# Patient Record
Sex: Female | Born: 1990 | Race: Black or African American | Hispanic: No | Marital: Single | Smoking: Never smoker
Health system: Southern US, Community
[De-identification: ages and names within clinical notes are randomized; demographics above are authoritative.]

## PROBLEM LIST (undated history)

## (undated) ENCOUNTER — Inpatient Hospital Stay (HOSPITAL_COMMUNITY): Payer: Self-pay

## (undated) HISTORY — PX: NO PAST SURGERIES: SHX2092

---

## 2022-02-10 DIAGNOSIS — Z0289 Encounter for other administrative examinations: Secondary | ICD-10-CM | POA: Diagnosis not present

## 2022-02-10 DIAGNOSIS — Z113 Encounter for screening for infections with a predominantly sexual mode of transmission: Secondary | ICD-10-CM | POA: Diagnosis not present

## 2022-02-10 DIAGNOSIS — Z111 Encounter for screening for respiratory tuberculosis: Secondary | ICD-10-CM | POA: Diagnosis not present

## 2022-02-10 DIAGNOSIS — Z114 Encounter for screening for human immunodeficiency virus [HIV]: Secondary | ICD-10-CM | POA: Diagnosis not present

## 2022-02-10 DIAGNOSIS — Z23 Encounter for immunization: Secondary | ICD-10-CM | POA: Diagnosis not present

## 2022-02-20 ENCOUNTER — Other Ambulatory Visit: Payer: Self-pay | Admitting: Obstetrics and Gynecology

## 2022-02-20 ENCOUNTER — Ambulatory Visit
Admission: RE | Admit: 2022-02-20 | Discharge: 2022-02-20 | Disposition: A | Discharge status: Home / Self Care | Payer: No Typology Code available for payment source | Source: Ambulatory Visit | Attending: Obstetrics and Gynecology | Admitting: Obstetrics and Gynecology

## 2022-02-20 DIAGNOSIS — R9389 Abnormal findings on diagnostic imaging of other specified body structures: Secondary | ICD-10-CM

## 2022-02-21 ENCOUNTER — Telehealth: Payer: Self-pay

## 2022-02-21 ENCOUNTER — Other Ambulatory Visit (HOSPITAL_COMMUNITY): Payer: Self-pay

## 2022-02-21 NOTE — Telephone Encounter (Signed)
[facility] Patient Advocate Encounter ? ?Insurance verification completed.   ? ?The patient is uninsured and will need patient assistance for medication. ? ?We can complete the application and will need to meet with the patient for signatures and income documentation. ? ?Ady Heimann, CPhT ?Specialty Pharmacy Patient Advocate ?[facility] ?Phone: [phone] ?Fax:  [phone]  ?

## 2022-02-26 ENCOUNTER — Other Ambulatory Visit: Payer: Self-pay

## 2022-02-26 ENCOUNTER — Other Ambulatory Visit (HOSPITAL_COMMUNITY): Payer: Self-pay

## 2022-02-26 ENCOUNTER — Ambulatory Visit: Payer: Self-pay

## 2022-02-26 ENCOUNTER — Ambulatory Visit (INDEPENDENT_AMBULATORY_CARE_PROVIDER_SITE_OTHER): Payer: Medicaid Other | Admitting: Pharmacist

## 2022-02-26 ENCOUNTER — Ambulatory Visit (INDEPENDENT_AMBULATORY_CARE_PROVIDER_SITE_OTHER): Payer: Medicaid Other | Admitting: Infectious Diseases

## 2022-02-26 ENCOUNTER — Encounter: Payer: Self-pay | Admitting: Infectious Diseases

## 2022-02-26 VITALS — BP 144/98 | HR 96 | Temp 97.4°F | Wt 145.5 lb

## 2022-02-26 DIAGNOSIS — Z0000 Encounter for general adult medical examination without abnormal findings: Secondary | ICD-10-CM | POA: Diagnosis not present

## 2022-02-26 DIAGNOSIS — B20 Human immunodeficiency virus [HIV] disease: Secondary | ICD-10-CM | POA: Diagnosis not present

## 2022-02-26 DIAGNOSIS — Z21 Asymptomatic human immunodeficiency virus [HIV] infection status: Secondary | ICD-10-CM | POA: Insufficient documentation

## 2022-02-26 HISTORY — DX: Human immunodeficiency virus (HIV) disease: B20

## 2022-02-26 HISTORY — DX: Asymptomatic human immunodeficiency virus (hiv) infection status: Z21

## 2022-02-26 MED ORDER — BICTEGRAVIR-EMTRICITAB-TENOFOV 50-200-25 MG PO TABS
1.0000 | ORAL_TABLET | Freq: Every day | ORAL | 5 refills | Status: DC
  Filled 2022-02-26 – 2022-04-09 (×2): qty 30, 30d supply, fill #0
  Filled 2022-05-08: qty 30, 30d supply, fill #1
  Filled 2022-06-09: qty 30, 30d supply, fill #2
  Filled 2022-07-25 – 2022-08-11 (×2): qty 30, 30d supply, fill #3
  Filled 2022-08-28 – 2022-09-26 (×2): qty 30, 30d supply, fill #4
  Filled 2022-10-16 – 2022-10-22 (×2): qty 30, 30d supply, fill #5

## 2022-02-26 NOTE — Assessment & Plan Note (Addendum)
Had pap smear at health department - will try to get records from refugee coordinator.  ?Uncertain as to vaccination history - will also try to get these records and continue with schedule.  ?TB screen at HD negative recently - will get quant here to have on file in system ?Hep b negative with reactive antibody - will repeat here to get on file.  ?Not interested in contraception at this time.  ?

## 2022-02-26 NOTE — Assessment & Plan Note (Signed)
New patient here to establish for HIV care. ? ?Her in [place] as refugee from [place] now 2 months. She is currently taking lopinavir based regimen from what I can deduce from reviewing pill charts with her, but ultimately not quite sure. Will switch to Biktarvy once daily and find out hepatitis B status. 4 weeks samples given today and shipments to be set up with [facility] once she has her Medicaid card available.   ?Pregnancy test recently negative at HD on 4/03 with abstinence as contraception method presently. RPR testing was non reactive 4/03, Hepatitis C negative 4/03, Urine GC/CT negative.  ? ?I discussed with Haizley Dasch treatment options/side effects, benefits of treatment and long-term outcomes. I discussed how HIV is transmitted and the process of untreated HIV including increased risk for opportunistic infections, cancer, dementia and renal failure. Patient was counseled on routine HIV care including medication adherence, blood monitoring, necessary vaccines and follow up visits. Counseled regarding safe sex practices including: condom use, partner disclosure, limiting partners. Patient spent time talking with our pharmacist regarding successful practices of ART and understands to reach out to our clinic in the future with questions.  ? ?General introduction to our clinic and integrated services. ?Will need dental referral at next OV.  ? ?I spent greater than 60 minutes with the patient today with time spent in review of records from outside HD, multiple lab tests and in direct counseling of patient today.  ?

## 2022-02-26 NOTE — Patient Instructions (Addendum)
Please schedule an appointment back in 6 weeks or so so we can make sure your new medication.  ? ?Biktarvy is the pill I would like for you to start taking to treat you - this will need to be taken once a day around the same time.  ?- Common side effects for a short time frame usually include headaches, nausea and diarrhea ?- OK to take over the counter tylenol for headaches and imodium for diarrhea ?- Try taking with food if you are nauseated  ?- If you take any multivitamins or supplements please separate them from your Biktarvy by 6 hours before and after. The main thing is do not have them in the stomach at the same time. ? ? ? ? ?Tafadhali panga ratiba ya kurudi baada ya wiki 6 au zaidi ili tuweze kuhakikisha kuwa una dawa mpya. ? ?Biktarvy ni kidonge ambacho ningependa uanze kukitumia ili [employer] - hii itahitaji kuchukuliwa mara moja kwa siku karibu wakati huo huo. ?- Madhara ya Crenshaw kwa muda mfupi kawaida hujumuisha maumivu ya Ringo, kichefuchefu na Kittie Plater ?- Sawa kuchukua dawa ya tylenol kwa maumivu ya kichwa na imodium ya Kittie Plater ?- Malachi Bonds kuchukua pamoja na chakula ikiwa una kichefuchefu ?- Lavonna Monarch unatumia multivitamini au virutubisho vyovyote tafadhali zitenganishe na Biktarvy yako kwa saa 6 kabla na baada. Jambo kuu ni usiwe nao kwenye tumbo kwa wakati mmoja. ?

## 2022-02-26 NOTE — Progress Notes (Signed)
? ?Name: Caitlin Marks  ?DOB: 06-11-1991 ?MRN: [id number] ?PCP: Pcp, No  ?  ? ?Brief Narrative:  ?Caitlin Marks is a 31 y.o. female with HIV. Dx 2018. Refugee from [place] working with [employer] (Caseworker Caitlin Marks / Caitlin Marks) [phone] ?CD4 nadir unknown  ?HIV Risk: sexual ?History of OIs: none known ?Intake Labs 02/2022: ?Hep B sAg (-), sAb (+), cAb (-); Hep A (), Hep C (-) ?Quantiferon (-) ?G6PD: () ? ? ?Previous Regimens: ?Lopinavir / ritonavir STR she pointed to from [place]  ? ?Genotypes: ?None on file  ? ?Subjective:  ? ?CC: ?New patient for HIV treatment. Referred from [employer].  ? ? ?HPI: ?Her case worker, Caitlin Marks is here today to help interpret for her.  ? ?She has been in the [place] for about 2 months. Came here as a refugee with her two children. She is here with her 65 year old son, Caitlin Marks. She has one older child at home. She does not have a partner and unmarried currently.  ? ?She is taking a pill a day for treatment from [place] but she is not certain what this one is. We spent time looking at charts and online references and she pointed to a lopinavir/ritonavir combination tablet.  She has no side effects to this whatsoever.  She was not born with HIV.  ? ?She has had TB testing at the [facility].  ?She has not been to see a dentist locally - no current problems.  ?Had a pap smear test at local health department recently.  ?She is not currently sexually active with any partner. Feels safe in the home.  Lives in an apartment currently.  ? ?Reports  no complaints today suggestive of associated opportunistic infection or advancing HIV disease such as fevers, night sweats, weight loss, anorexia, cough, SOB, nausea, vomiting, diarrhea, headache, sensory changes, lymphadenopathy or oral thrush. She said she has never had any illnesses from what she recalls related to HIV or otherwise. Healthy childhood. Parent are both deceased from "natural sicknesses."   ? ?Smokes sheesha rarely, no drugs or consistent alcohol use.  ? ? ? ? ?  02/26/2022  ? 10:35 AM  ?Depression screen PHQ 2/9  ?Decreased Interest 0  ?Down, Depressed, Hopeless 0  ?PHQ - 2 Score 0  ? ? ?Review of Systems  ?Constitutional:  Negative for chills, fever, malaise/fatigue and weight loss.  ?HENT:  Negative for sore throat.   ?Respiratory:  Negative for cough, sputum production and shortness of breath.   ?Cardiovascular: Negative.   ?Gastrointestinal:  Negative for abdominal pain, diarrhea and vomiting.  ?Musculoskeletal:  Negative for joint pain, myalgias and neck pain.  ?Skin:  Negative for rash.  ?Neurological:  Negative for headaches.  ?Psychiatric/Behavioral:  Negative for depression and substance abuse. The patient is not nervous/anxious.   ? ?Past Medical History:  ?Diagnosis Date  ? HIV (human immunodeficiency virus infection) (HCC)   ? HIV (human immunodeficiency virus infection) (HCC) 02/26/2022  ? ? ?No outpatient medications prior to visit.  ? ?No facility-administered medications prior to visit.  ?  ? ?No Known Allergies ? ?Social History  ? ?Tobacco Use  ? Smoking status: Some Days  ?  Types: Cigarettes  ? Smokeless tobacco: Never  ?Substance Use Topics  ? Alcohol use: Yes  ?  Comment: occ  ? Drug use: Never  ? ? ?Family History  ?Problem Relation Age of Onset  ? Other Mother   ? Diabetes Neg Hx   ? Cancer  Neg Hx   ? ? ?Social History  ? ?Substance and Sexual Activity  ?Sexual Activity Not on file  ? ? ? ?Objective:  ? ?Vitals:  ? 02/26/22 1039  ?BP: (!) 144/98  ?Pulse: 96  ?Temp: (!) 97.4 ?F (36.3 ?C)  ?TempSrc: Temporal  ?Weight: 145 lb 8.1 oz (66 kg)  ? ?There is no height or weight on file to calculate BMI. ? ?Physical Exam ?Vitals reviewed.  ?HENT:  ?   Mouth/Throat:  ?   Mouth: No oral lesions.  ?   Dentition: No dental abscesses.  ?Cardiovascular:  ?   Rate and Rhythm: Normal rate and regular rhythm.  ?   Heart sounds: Normal heart sounds.  ?Pulmonary:  ?   Effort: Pulmonary effort is  normal.  ?   Breath sounds: Normal breath sounds.  ?Abdominal:  ?   General: There is no distension.  ?   Palpations: Abdomen is soft.  ?   Tenderness: There is no abdominal tenderness.  ?Musculoskeletal:     ?   General: No tenderness. Normal range of motion.  ?Lymphadenopathy:  ?   Cervical: No cervical adenopathy.  ?Skin: ?   General: Skin is warm and dry.  ?   Findings: No rash.  ?Neurological:  ?   Mental Status: She is alert and oriented to person, place, and time.  ?Psychiatric:     ?   Judgment: Judgment normal.  ? ? ?Lab Results ?No results found for: WBC, HGB, HCT, MCV, PLT No results found for: CREATININE, BUN, NA, K, CL, CO2 No results found for: ALT, AST, GGT, ALKPHOS, BILITOT  ?No results found for: CHOL, HDL, LDLCALC, LDLDIRECT, TRIG, CHOLHDL ?No results found for: HIV1RNAQUANT, HIV1RNAVL, CD4TABS ? ? ?Assessment & Plan:  ? ?Problem List Items Addressed This Visit   ? ?  ? Unprioritized  ? Healthcare maintenance  ?  Had pap smear at health department - will try to get records from refugee coordinator.  ?Uncertain as to vaccination history - will also try to get these records and continue with schedule.  ?TB screen at HD negative recently - will get quant here to have on file in system ?Hep b negative with reactive antibody - will repeat here to get on file.  ?Not interested in contraception at this time.  ? ?  ?  ? HIV (human immunodeficiency virus infection) (HCC) - Primary  ?  New patient here to establish for HIV care. ? ?[place] from [place] now 2 months. She is currently taking lopinavir based regimen from what I can deduce from reviewing pill charts with her, but ultimately not quite sure. Will switch to Biktarvy once daily and find out hepatitis B status. 4 weeks samples given today and [facility] to [facility] once she has her Medicaid card available.   ?Pregnancy test recently negative at HD on 4/03 with abstinence as contraception method presently. RPR testing was non  reactive 4/03, Hepatitis C negative 4/03, Urine GC/CT negative.  ? ?I discussed with Gustavo Quest treatment options/side effects, benefits of treatment and long-term outcomes. I discussed how HIV is transmitted and the process of untreated HIV including increased risk for opportunistic infections, cancer, dementia and renal failure. Patient was counseled on routine HIV care including medication adherence, blood monitoring, necessary vaccines and follow up visits. Counseled regarding safe sex practices including: condom use, partner disclosure, limiting partners. Patient spent time talking with our pharmacist regarding successful practices of ART and  understands to reach out to our clinic in the future with questions.  ? ?General introduction to our clinic and integrated services. ?Will need dental referral at next OV.  ? ?I spent greater than 60 minutes with the patient today with time spent in review of records from outside HD, multiple lab tests and in direct counseling of patient today.  ?  ?  ? Relevant Medications  ? bictegravir-emtricitabine-tenofovir AF (BIKTARVY) 50-200-25 MG TABS tablet  ? Other Relevant Orders  ? QuantiFERON-TB Gold Plus  ? Hepatitis B surface antibody,qualitative  ? Hepatitis B surface antigen  ? Hepatitis A Ab, Total  ? HIV 1 RNA quant-no reflex-bld  ? T-helper cells (CD4) count (not at [facility])  ? Hepatitis C Antibody  ? ? ?Rexene Alberts, MSN, NP-C ?[facility] ?[facility] ?Pager: [phone] ?Office: [phone] ? ?02/26/22  ?12:17 PM ?  ?

## 2022-02-26 NOTE — Progress Notes (Signed)
? ?02/26/2022 ? ?HPI: Caitlin Marks is a 31 y.o. female who presents to the [place] clinic today to establish care for HIV infection. ? ?Patient Active Problem List  ? Diagnosis Date Noted  ? HIV (human immunodeficiency virus infection) ([place]) 02/26/2022  ? Healthcare maintenance 02/26/2022  ? ? ?Patient's Medications  ?New Prescriptions  ? BICTEGRAVIR-EMTRICITABINE-TENOFOVIR AF (BIKTARVY) 50-200-25 MG TABS TABLET    Take 1 tablet by mouth daily. Try to take at the same time each day with or without food.  ?Previous Medications  ? No medications on file  ?Modified Medications  ? No medications on file  ?Discontinued Medications  ? No medications on file  ? ? ?Allergies: ?No Known Allergies ? ?Past Medical History: ?Past Medical History:  ?Diagnosis Date  ? HIV (human immunodeficiency virus infection) ([place])   ? HIV (human immunodeficiency virus infection) ([place]) 02/26/2022  ? ? ?Social History: ?Social History  ? ?Socioeconomic History  ? Marital status: Single  ?  Spouse name: Not on file  ? Number of children: Not on file  ? Years of education: Not on file  ? Highest education level: Not on file  ?Occupational History  ? Not on file  ?Tobacco Use  ? Smoking status: Some Days  ?  Types: Cigarettes  ? Smokeless tobacco: Never  ?Substance and Sexual Activity  ? Alcohol use: Yes  ?  Comment: occ  ? Drug use: Never  ? Sexual activity: Not on file  ?Other Topics Concern  ? Not on file  ?Social History Narrative  ? Not on file  ? ?Social Determinants of Health  ? ?Financial Resource Strain: Not on file  ?Food Insecurity: Not on file  ?Transportation Needs: Not on file  ?Physical Activity: Not on file  ?Stress: Not on file  ?Social Connections: Not on file  ? ? ?Labs: ?No results found for: HIV1RNAQUANT, HIV1RNAVL, CD4TABS ? ?RPR and STI ?No results found for: LABRPR, RPRTITER ? ?   ? View : No data to display.  ?  ?  ?  ? ? ?Hepatitis B ?No results found for: HEPBSAB, HEPBSAG, HEPBCAB ?Hepatitis C ?No results found for: [place],  HCVRNAPCRQN ?Hepatitis A ?No results found for: HAV ?Lipids: ?No results found for: CHOL, TRIG, HDL, CHOLHDL, VLDL, LDLCALC ? ?Current HIV Regimen: ?Combo medication dispensed from [place]  ? ?Assessment: ?Caitlin Marks is here today to initiate care with our clinic for HIV infection. Colletta Maryland will start patient on [place]. Will provide samples today as insurance is being processed. Patient's case worker was present and served as our Astronomer.  ? ?Explained that Phillips Odor is a one pill once daily medication with or without food and the importance of not missing any doses. Explained that taking it with food may help reduce side effects. Explained resistance and how it develops and why it is so important to take Biktarvy daily and not skip days or doses. Counseled patient to take it around the same time each day. Cautioned on possible side effects the first week or so including nausea, diarrhea, dizziness, and headaches but that they should resolve after the first couple of weeks. Counseled patient to separate Biktarvy from divalent cations including multivitamins. Provided patient with Amanda's card and to call with any issues/questions/concerns.  ? ?Counseled patient to not take their previous HIV medication today and to stop taking. Counseled patient that they can start taking Biktarvy today. Patient verbalized understanding.  ? ?Informed patient that [place] will contact once insurance is approved for mail order services. Provided  patient with pillbox and pill keychain. ? ?Plan: ?- Biktarvy samples provided to patient (4 bottles of 7 tablets each) ?- Start Biktarvy ?- Follow up in 6 weeks with Colletta Maryland ? ?Marlowe Alt, PharmD Candidate ?02/26/2022 11:40 AM ? ? ? ?

## 2022-02-27 ENCOUNTER — Other Ambulatory Visit: Payer: Self-pay | Admitting: Pharmacist

## 2022-02-27 ENCOUNTER — Other Ambulatory Visit (HOSPITAL_COMMUNITY): Payer: Self-pay

## 2022-02-27 DIAGNOSIS — B20 Human immunodeficiency virus [HIV] disease: Secondary | ICD-10-CM

## 2022-02-27 MED ORDER — BICTEGRAVIR-EMTRICITAB-TENOFOV 50-200-25 MG PO TABS: 1.0000 | ORAL_TABLET | Freq: Every day | ORAL | 0 refills | Status: AC

## 2022-02-27 NOTE — Progress Notes (Signed)
Medication Samples have been provided to the patient. ? ?Drug name: Biktarvy        ?Strength: 50/200/25 mg       ?Qty: 28 tablets (4 bottles) ?LOT: [id number]   ?Exp.Date: 10/24 ? ?Dosing instructions: Take one tablet by mouth once daily ? ?The patient has been instructed regarding the correct time, dose, and frequency of taking this medication, including desired effects and most common side effects.  ? ?Fatih Stalvey, PharmD, CPP ?Clinical Pharmacist Practitioner ?Infectious Diseases Clinical Pharmacist ?[facility] ? ?

## 2022-02-28 LAB — HELPER T-LYMPH-CD4 (ARMC ONLY)
% CD 4 Pos. Lymph.: 42.3 % (ref 30.8–58.5)
Absolute CD 4 Helper: 888 /uL (ref 359–1519)
Basophils Absolute: 0 10*3/uL (ref 0.0–0.2)
Basos: 1 %
EOS (ABSOLUTE): 0.1 10*3/uL (ref 0.0–0.4)
Eos: 1 %
Hematocrit: 46.9 % — ABNORMAL HIGH (ref 34.0–46.6)
Hemoglobin: 15.4 g/dL (ref 11.1–15.9)
Immature Grans (Abs): 0 10*3/uL (ref 0.0–0.1)
Immature Granulocytes: 0 %
Lymphocytes Absolute: 2.1 10*3/uL (ref 0.7–3.1)
Lymphs: 28 %
MCH: 29.6 pg (ref 26.6–33.0)
MCHC: 32.8 g/dL (ref 31.5–35.7)
MCV: 90 fL (ref 79–97)
Monocytes Absolute: 0.3 10*3/uL (ref 0.1–0.9)
Monocytes: 4 %
Neutrophils Absolute: 5.1 10*3/uL (ref 1.4–7.0)
Neutrophils: 66 %
Platelets: 302 10*3/uL (ref 150–450)
RBC: 5.21 x10E6/uL (ref 3.77–5.28)
RDW: 13.4 % (ref 11.7–15.4)
WBC: 7.7 10*3/uL (ref 3.4–10.8)

## 2022-03-01 LAB — QUANTIFERON-TB GOLD PLUS
Mitogen-NIL: 9.58 IU/mL
NIL: 0.17 IU/mL
QuantiFERON-TB Gold Plus: NEGATIVE
TB1-NIL: 0 IU/mL
TB2-NIL: <0 IU/mL

## 2022-03-01 LAB — HIV-1 RNA QUANT-NO REFLEX-BLD
HIV 1 RNA Quant: 31 copies/mL — ABNORMAL HIGH
HIV-1 RNA Quant, Log: 1.49 Log copies/mL — ABNORMAL HIGH

## 2022-03-01 LAB — HEPATITIS C ANTIBODY
Hepatitis C Ab: NONREACTIVE
SIGNAL TO CUT-OFF: 0.42 (ref <1.00)

## 2022-03-01 LAB — HEPATITIS B SURFACE ANTIGEN: Hepatitis B Surface Ag: NONREACTIVE

## 2022-03-01 LAB — HEPATITIS B SURFACE ANTIBODY,QUALITATIVE: Hep B S Ab: REACTIVE — AB

## 2022-03-01 LAB — HEPATITIS A ANTIBODY, TOTAL: Hepatitis A AB,Total: REACTIVE — AB

## 2022-03-03 ENCOUNTER — Other Ambulatory Visit (HOSPITAL_COMMUNITY): Payer: Self-pay

## 2022-03-04 LAB — T-HELPER CELLS (CD4) COUNT (NOT AT ARMC)

## 2022-03-10 ENCOUNTER — Other Ambulatory Visit (HOSPITAL_COMMUNITY): Payer: Self-pay

## 2022-03-11 ENCOUNTER — Encounter: Payer: Self-pay | Admitting: Infectious Diseases

## 2022-03-26 ENCOUNTER — Other Ambulatory Visit (HOSPITAL_COMMUNITY): Payer: Self-pay

## 2022-04-02 NOTE — Congregational Nurse Program (Signed)
  Dept: [phone]   Congregational Nurse Program Note  Date of Encounter: 04/02/2022  Past Medical History: Past Medical History:  Diagnosis Date   HIV (human immunodeficiency virus infection) (HCC)    HIV (human immunodeficiency virus infection) (HCC) 02/26/2022    Encounter Details:  CNP Questionnaire - 04/02/22 1304       Questionnaire   Do you give verbal consent to treat you today? Yes    Location Patient Served  [place]    Visit Setting Church or Organization    Patient Status Refugee    Insurance [facility]    Insurance Referral N/A    Medication N/A    Medical Provider No    Screening Referrals N/A    Medical Referral [facility]    Medical Appointment Made [facility]    Food N/A    Transportation N/A    Housing/Utilities N/A    Interpersonal Safety N/A    Intervention Advocate;Navigate Healthcare System;Case Management;Counsel;Educate;Support    ED Visit Averted Yes    Life-Saving Intervention Made N/A            Patient is followed by Infectious Disease for HIV. Needs PCP. Will establish care with [facility] PCP.  Nicole Cella Bron Snellings RN BSN PCCN  [facility] [phone]-office

## 2022-04-09 ENCOUNTER — Ambulatory Visit (INDEPENDENT_AMBULATORY_CARE_PROVIDER_SITE_OTHER): Payer: Medicaid Other | Admitting: Infectious Diseases

## 2022-04-09 ENCOUNTER — Other Ambulatory Visit (HOSPITAL_COMMUNITY): Payer: Self-pay

## 2022-04-09 ENCOUNTER — Other Ambulatory Visit: Payer: Self-pay

## 2022-04-09 ENCOUNTER — Encounter: Payer: Self-pay | Admitting: Infectious Diseases

## 2022-04-09 VITALS — BP 149/97 | HR 85 | Temp 98.3°F

## 2022-04-09 DIAGNOSIS — R030 Elevated blood-pressure reading, without diagnosis of hypertension: Secondary | ICD-10-CM | POA: Diagnosis not present

## 2022-04-09 DIAGNOSIS — R000 Tachycardia, unspecified: Secondary | ICD-10-CM | POA: Diagnosis not present

## 2022-04-09 DIAGNOSIS — Z21 Asymptomatic human immunodeficiency virus [HIV] infection status: Secondary | ICD-10-CM

## 2022-04-09 DIAGNOSIS — B20 Human immunodeficiency virus [HIV] disease: Secondary | ICD-10-CM

## 2022-04-09 DIAGNOSIS — Z0000 Encounter for general adult medical examination without abnormal findings: Secondary | ICD-10-CM

## 2022-04-09 MED ORDER — BLOOD PRESSURE MONITOR AUTOMAT DEVI
1.0000 | Freq: Once | 0 refills | Status: DC
  Filled 2022-04-09: qty 1, fill #0

## 2022-04-09 MED ORDER — BLOOD PRESSURE MONITOR AUTOMAT DEVI: 1.0000 | Freq: Once | 0 refills | Status: AC

## 2022-04-09 NOTE — Assessment & Plan Note (Signed)
Will try to get her a blood pressure cuff to monitor at home. Counseled on how to measure and record readings. If she can get this will have her bring to future appointments.

## 2022-04-09 NOTE — Progress Notes (Signed)
Name: Caitlin Marks  DOB: 06-04-1991 MRN: [id number] PCP: Pcp, No     Brief Narrative:  Caitlin Marks is a 31 y.o. female with HIV. Dx 2018. Refugee from [place] working with [employer] (Caseworker Ayesha Mohair / Alla German) [phone] CD4 nadir unknown  HIV Risk: sexual History of OIs: none known Intake Labs 02/2022: Hep B sAg (-), sAb (+), cAb (-); Hep A (), Hep C (-) Quantiferon (-) G6PD: ()   Previous Regimens: Lopinavir / ritonavir STR she pointed to from [place]   Genotypes: None on file   Subjective:   CC: FU on new HIV medications.     HPI: Her case worker, Alla German is with her today. Swahili interpretor also present to facilitate.   She has been getting her biktarvy in everyday. Has noticed that she has had some "racing heart" feelings. This does not happen everyday but only occasionally during the month. She feels her heart beats fast but has no other symptoms during these brief periods. No other new medications and has only noticed it since taking the new mediation.   She has not been able to get her fill from the pharmacy yet. Does not recall any missed calls. They discussed that they may do better picking up from pharmacy in person.   She is not very interested in recommended vaccinations.       04/09/2022    2:51 PM  Depression screen PHQ 2/9  Decreased Interest 0  Down, Depressed, Hopeless 0  PHQ - 2 Score 0    Review of Systems  Constitutional:  Negative for chills, fever, malaise/fatigue and weight loss.  HENT:  Negative for sore throat.   Respiratory:  Negative for cough, sputum production and shortness of breath.   Cardiovascular:  Positive for palpitations. Negative for chest pain, orthopnea and leg swelling.  Gastrointestinal:  Negative for abdominal pain, diarrhea and vomiting.  Musculoskeletal:  Negative for joint pain, myalgias and neck pain.  Skin:  Negative for rash.  Neurological:  Negative for headaches.   Psychiatric/Behavioral:  Negative for depression and substance abuse. The patient is not nervous/anxious.     Past Medical History:  Diagnosis Date   HIV (human immunodeficiency virus infection) (HCC)    HIV (human immunodeficiency virus infection) (HCC) 02/26/2022    Outpatient Medications Prior to Visit  Medication Sig Dispense Refill   bictegravir-emtricitabine-tenofovir AF (BIKTARVY) 50-200-25 MG TABS tablet Take 1 tablet by mouth daily. Try to take at the same time each day with or without food. 30 tablet 5   No facility-administered medications prior to visit.     No Known Allergies  Social History   Tobacco Use   Smoking status: Former    Types: Cigarettes   Smokeless tobacco: Never  Substance Use Topics   Alcohol use: Yes    Comment: occasionally   Drug use: Never    Family History  Problem Relation Age of Onset   Other Mother    Diabetes Neg Hx    Cancer Neg Hx     Social History   Substance and Sexual Activity  Sexual Activity Not on file   Comment: condoms accepted     Objective:   Vitals:   04/09/22 1443  BP: (!) 149/97  Pulse: 85  Temp: 98.3 F (36.8 C)  TempSrc: Oral  SpO2: 100%   There is no height or weight on file to calculate BMI.  Physical Exam Vitals reviewed.  HENT:     Mouth/Throat:  Mouth: No oral lesions.     Dentition: No dental abscesses.  Cardiovascular:     Rate and Rhythm: Normal rate and regular rhythm.     Heart sounds: Normal heart sounds.  Pulmonary:     Effort: Pulmonary effort is normal.     Breath sounds: Normal breath sounds.  Abdominal:     General: There is no distension.     Palpations: Abdomen is soft.     Tenderness: There is no abdominal tenderness.  Musculoskeletal:        General: No tenderness. Normal range of motion.  Lymphadenopathy:     Cervical: No cervical adenopathy.  Skin:    General: Skin is warm and dry.     Findings: No rash.  Neurological:     Mental Status: She is alert and  oriented to person, place, and time.  Psychiatric:        Judgment: Judgment normal.    Lab Results Lab Results  Component Value Date   WBC 7.7 02/26/2022   HGB 15.4 02/26/2022   HCT 46.9 (H) 02/26/2022   MCV 90 02/26/2022   PLT 302 02/26/2022   No results found for: CREATININE, BUN, NA, K, CL, CO2 No results found for: ALT, AST, GGT, ALKPHOS, BILITOT  No results found for: CHOL, HDL, LDLCALC, LDLDIRECT, TRIG, CHOLHDL HIV 1 RNA Quant (copies/mL)  Date Value  02/26/2022 31 (H)     Assessment & Plan:   Problem List Items Addressed This Visit       Unprioritized   Elevated blood pressure reading - Primary    Will try to get her a blood pressure cuff to monitor at home. Counseled on how to measure and record readings. If she can get this will have her bring to future appointments.        Relevant Medications   Blood Pressure Monitoring (BLOOD PRESSURE MONITOR AUTOMAT) DEVI   Healthcare maintenance    Prevnar 20 & menveo recommended - counseled and she declined.  Hepatitis B and A immune.   Pap smear due April 2024.        HIV (human immunodeficiency virus infection) (HCC)    Will check HIV RNA with med switch today. She declined vaccines.  Will see if Marchelle Folks can help to sort out issues with her Biktarvy for further fills. She has active medicaid and should be covered.  Pap due in April 2024.   RTC in 43m.        Racing heart beat    Cardiac exam normal today. HR 80s. I have not seen this as a side effect to Biktarvy in the last. Does not indicate it is related to anxiety at all.   Will check pregnancy test, TSH and A1C to be complete and rule out metabolic causes. No associated cardiac symptoms.        Relevant Orders   hCG, quantitative, pregnancy   TSH + free T4   Hemoglobin A1c   Other Visit Diagnoses     HIV disease (HCC)       Relevant Orders   HIV 1 RNA quant-no reflex-bld      Rexene Alberts, MSN, NP-C [facility] for Infectious  Disease [facility] Pager: [phone] Office: [phone]  04/09/22  3:46 PM

## 2022-04-09 NOTE — Assessment & Plan Note (Signed)
Will check HIV RNA with med switch today. She declined vaccines.  Will see if Marchelle Folks can help to sort out issues with her Biktarvy for further fills. She has active medicaid and should be covered.  Pap due in April 2024.   RTC in 74m.

## 2022-04-09 NOTE — Assessment & Plan Note (Signed)
Cardiac exam normal today. HR 80s. I have not seen this as a side effect to Biktarvy in the last. Does not indicate it is related to anxiety at all.   Will check pregnancy test, TSH and A1C to be complete and rule out metabolic causes. No associated cardiac symptoms.

## 2022-04-09 NOTE — Assessment & Plan Note (Addendum)
Prevnar 20 & menveo recommended - counseled and she declined.  Hepatitis B and A immune.   Pap smear due April 2024.

## 2022-04-09 NOTE — Patient Instructions (Addendum)
Would like to see your blood pressure no higher than 140/90 (prefer closer to 130/80). Check your blood pressure a few times a week - sometimes in the morning, sometimes at night and write them down to bring to your next appointment.   Your heart rate is normal today at 85 beats per minute (normal is 60-100).   I will check a few things with your blood work to make sure nothing abnormal to explain the heart beat racing feeling. I don't see where this could be caused by the [facility] though and have not heard anyone else describe that before.   I do recommend the pneumonia vaccine and meningitis vaccine if you are willing to get it. These vaccines are given periodically for all people to help prevent serious illness related to them.   Please come back for your next visit in 6 months.      Je, ungependa kuona shinikizo lako la damu lisizidi 140/90 (pendelea karibu 130/80). Angalia shinikizo la damu yako mara chache kwa wiki - wakati mwingine asubuhi, wakati mwingine usiku na uandike ili [place] miadi [place].  Kiwango cha moyo wako ni cha kawaida leo kwa midundo 85 kwa dakika (kawaida ni 60-100).  Nitaangalia mambo machache na [place] ya damu ili [place] kitu kisicho cha kawaida kuelezea hisia ya mpigo wa moyo. Sioni ni wapi hii inaweza kusababishwa na [place] ingawa na sijasikia mtu mwingine yeyote akielezea hilo hapo awali.  Ninapendekeza chanjo ya nimonia na chanjo ya meninjitisi ikiwa uko tayari kuipata. Chanjo hizi hutolewa mara kwa mara kwa watu wote ili kusaidia [place] ugonjwa mbaya unaohusiana nao.   Tafadhali rudi kwa ziara yako ijayo baada ya miezi 6.

## 2022-04-13 LAB — TSH+FREE T4: TSH W/REFLEX TO FT4: 2.23 mIU/L

## 2022-04-13 LAB — HEMOGLOBIN A1C
Hgb A1c MFr Bld: 5.2 % of total Hgb (ref <5.7)
Mean Plasma Glucose: 103 mg/dL
eAG (mmol/L): 5.7 mmol/L

## 2022-04-13 LAB — HIV-1 RNA QUANT-NO REFLEX-BLD
HIV 1 RNA Quant: NOT DETECTED Copies/mL
HIV-1 RNA Quant, Log: NOT DETECTED Log cps/mL

## 2022-04-13 LAB — HCG, QUANTITATIVE, PREGNANCY: HCG, Total, QN: <5 m[IU]/mL

## 2022-04-16 ENCOUNTER — Other Ambulatory Visit (HOSPITAL_COMMUNITY): Payer: Self-pay

## 2022-04-17 ENCOUNTER — Telehealth: Payer: Self-pay

## 2022-04-17 NOTE — Telephone Encounter (Signed)
Patient has received a medical bill and is requesting assistance to add insurance information. [facility] called and medicaid information added to her account.  Nicole Cella Kymia Simi RN BSN PCCN  [facility] [phone]-cell [phone]-office

## 2022-04-23 NOTE — Progress Notes (Signed)
  Subjective:    Caitlin Marks - 31 y.o. female MRN [id number]  Date of birth: 12-12-1990  HPI  Caitlin Marks is to establish care.   Current issues and/or concerns: None   ROS per HPI    Health Maintenance:  Health Maintenance Due  Topic Date Due   COVID-19 Vaccine (1) Never done   TETANUS/TDAP  Never done   PAP-Cervical Cytology Screening  Never done   PAP SMEAR-Modifier  Never done     Past Medical History: Patient Active Problem List   Diagnosis Date Noted   Elevated blood pressure reading 04/09/2022   Racing heart beat 04/09/2022   HIV (human immunodeficiency virus infection) (HCC) 02/26/2022   Healthcare maintenance 02/26/2022    Social History   reports that she has quit smoking. Her smoking use included cigarettes. She has been exposed to tobacco smoke. She has never used smokeless tobacco. She reports current alcohol use. She reports that she does not use drugs.   Family History  family history includes Other in her mother.   Medications: reviewed and updated   Objective:   Physical Exam BP (!) 145/91 (BP Location: Left Arm, Patient Position: Sitting, Cuff Size: Normal)   Pulse 71   Temp 98.3 F (36.8 C)   Resp 16   Ht 4' 11.84" (1.52 m)   Wt 161 lb (73 kg)   SpO2 98%   BMI 31.61 kg/m   Physical Exam HENT:     Head: Normocephalic and atraumatic.  Eyes:     Extraocular Movements: Extraocular movements intact.     Conjunctiva/sclera: Conjunctivae normal.     Pupils: Pupils are equal, round, and reactive to light.  Cardiovascular:     Rate and Rhythm: Normal rate and regular rhythm.     Pulses: Normal pulses.     Heart sounds: Normal heart sounds.  Pulmonary:     Effort: Pulmonary effort is normal.     Breath sounds: Normal breath sounds.  Musculoskeletal:     Cervical back: Normal range of motion and neck supple.  Neurological:     General: No focal deficit present.     Mental Status: She is alert and oriented to person, place, and time.   Psychiatric:        Mood and Affect: Mood normal.        Behavior: Behavior normal.       Assessment & Plan:  1. Encounter to establish care - Patient presents today to establish care.  - Return for annual physical examination, labs, and health maintenance. Arrive fasting meaning having no food for at least 8 hours prior to appointment. You may have only water or black coffee. Please take scheduled medications as normal.  2. Language barrier - [place] in-person interpreter, Jocelyn.     Patient was given clear instructions to go to Emergency Department or return to medical center if symptoms don't improve, worsen, or new problems develop.The patient verbalized understanding.  I discussed the assessment and treatment plan with the patient. The patient was provided an opportunity to ask questions and all were answered. The patient agreed with the plan and demonstrated an understanding of the instructions.   The patient was advised to call back or seek an in-person evaluation if the symptoms worsen or if the condition fails to improve as anticipated.    [name], NP 05/01/2022, 10:37 AM Primary Care at [facility]

## 2022-04-28 ENCOUNTER — Other Ambulatory Visit (HOSPITAL_COMMUNITY): Payer: Self-pay

## 2022-04-29 ENCOUNTER — Telehealth: Payer: Self-pay

## 2022-04-29 NOTE — Telephone Encounter (Signed)
Patient's sponsor/case manager called wanting to know where to get an at-home BP monitor. States they tried [employer] on [address], but they only had manual cuffs.   Recommended [facility] or [facility], asked them to please call back if they are still having difficulty finding the equipment.   Sandie Ano, RN

## 2022-05-01 ENCOUNTER — Encounter: Payer: Self-pay | Admitting: Family

## 2022-05-01 ENCOUNTER — Other Ambulatory Visit (HOSPITAL_COMMUNITY): Payer: Self-pay

## 2022-05-01 ENCOUNTER — Telehealth: Payer: Self-pay

## 2022-05-01 ENCOUNTER — Ambulatory Visit (INDEPENDENT_AMBULATORY_CARE_PROVIDER_SITE_OTHER): Payer: Medicaid Other | Admitting: Family

## 2022-05-01 VITALS — BP 145/91 | HR 71 | Temp 98.3°F | Resp 16 | Ht 59.84 in | Wt 161.0 lb

## 2022-05-01 DIAGNOSIS — Z7689 Persons encountering health services in other specified circumstances: Secondary | ICD-10-CM

## 2022-05-01 DIAGNOSIS — Z789 Other specified health status: Secondary | ICD-10-CM | POA: Diagnosis not present

## 2022-05-01 NOTE — Telephone Encounter (Signed)
Contacted patient with appointment reminder.Transportation assistance provided.  Meosha Castanon RN BSN PCCN  [facility] [phone]-cell [phone]-office  

## 2022-05-01 NOTE — Patient Instructions (Signed)
Thank you for choosing Primary Care at [facility] for your medical home!    Caitlin Marks was seen by Caitlin Fendt, NP today.   Caitlin Marks's primary care provider is Caitlin Fendt, NP.   For the best care possible,  you should try to see Caitlin Stabs, NP whenever you come to office.   We look forward to seeing you again soon!  If you have any questions about your visit today,  please call us at [phone]  Or feel free to reach your provider via MyChart.   Keeping you healthy   Get these tests Blood pressure- Have your blood pressure checked once a year by your healthcare provider.  Normal blood pressure is 120/80. Weight- Have your body mass index (BMI) calculated to screen for obesity.  BMI is a measure of body fat based on height and weight. You can also calculate your own BMI at https://www.west-esparza.com/. Cholesterol- Have your cholesterol checked regularly starting at age 46, sooner may be necessary if you have diabetes, high blood pressure, if a family member developed heart diseases at an early age or if you smoke.  Chlamydia, HIV, and other sexual transmitted disease- Get screened each year until the age of 11 then within three months of each new sexual partner. Diabetes- Have your blood sugar checked regularly if you have high blood pressure, high cholesterol, a family history of diabetes or if you are overweight.   Get these vaccines Flu shot- Every fall. Tetanus shot- Every 10 years. Menactra- Single dose; prevents meningitis.   Take these steps Don't smoke- If you do smoke, ask your healthcare provider about quitting. For tips on how to quit, go to www.smokefree.gov or call 1-800-QUIT-NOW. Be physically active- Exercise 5 days a week for at least 30 minutes.  If you are not already physically active start slow and gradually work up to 30 minutes of moderate physical activity.  Examples of moderate activity include walking briskly, mowing the yard, dancing, swimming  bicycling, etc. Eat a healthy diet- Eat a variety of healthy foods such as fruits, vegetables, low fat milk, low fat cheese, yogurt, lean meats, poultry, fish, beans, tofu, etc.  For more information on healthy eating, go to www.thenutritionsource.org Drink alcohol in moderation- Limit alcohol intake two drinks or less a day.  Never drink and drive. Dentist- Brush and floss teeth twice daily; visit your dentis twice a year. Depression-Your emotional health is as important as your physical health.  If you're feeling down, losing interest in things you normally enjoy please talk with your healthcare provider. Gun Safety- If you keep a gun in your home, keep it unloaded and with the safety lock on.  Bullets should be stored separately. Helmet use- Always wear a helmet when riding a motorcycle, bicycle, rollerblading or skateboarding. Safe sex- If you may be exposed to a sexually transmitted infection, use a condom Seat belts- Seat bels can save your life; always wear one. Smoke/Carbon Monoxide detectors- These detectors need to be installed on the appropriate level of your home.  Replace batteries at least once a year. Skin Cancer- When out in the sun, cover up and use sunscreen SPF 15 or higher. Violence- If anyone is threatening or hurting you, please tell your healthcare provider.

## 2022-05-05 ENCOUNTER — Other Ambulatory Visit (HOSPITAL_COMMUNITY): Payer: Self-pay

## 2022-05-08 ENCOUNTER — Other Ambulatory Visit (HOSPITAL_COMMUNITY): Payer: Self-pay

## 2022-05-09 ENCOUNTER — Other Ambulatory Visit (HOSPITAL_COMMUNITY): Payer: Self-pay

## 2022-05-14 ENCOUNTER — Other Ambulatory Visit (HOSPITAL_COMMUNITY): Payer: Self-pay

## 2022-05-28 ENCOUNTER — Other Ambulatory Visit (HOSPITAL_COMMUNITY): Payer: Self-pay

## 2022-05-30 ENCOUNTER — Other Ambulatory Visit (HOSPITAL_COMMUNITY): Payer: Self-pay

## 2022-06-09 ENCOUNTER — Other Ambulatory Visit (HOSPITAL_COMMUNITY): Payer: Self-pay

## 2022-06-16 ENCOUNTER — Other Ambulatory Visit (HOSPITAL_COMMUNITY): Payer: Self-pay

## 2022-06-23 NOTE — Progress Notes (Signed)
Erroneous encounter-disregard

## 2022-06-24 ENCOUNTER — Other Ambulatory Visit (HOSPITAL_COMMUNITY): Payer: Self-pay

## 2022-07-01 ENCOUNTER — Encounter: Payer: Self-pay | Admitting: Family

## 2022-07-01 DIAGNOSIS — Z124 Encounter for screening for malignant neoplasm of cervix: Secondary | ICD-10-CM

## 2022-07-01 DIAGNOSIS — Z789 Other specified health status: Secondary | ICD-10-CM

## 2022-07-01 DIAGNOSIS — Z13228 Encounter for screening for other metabolic disorders: Secondary | ICD-10-CM

## 2022-07-01 DIAGNOSIS — Z13220 Encounter for screening for lipoid disorders: Secondary | ICD-10-CM

## 2022-07-01 DIAGNOSIS — Z113 Encounter for screening for infections with a predominantly sexual mode of transmission: Secondary | ICD-10-CM

## 2022-07-01 DIAGNOSIS — Z0000 Encounter for general adult medical examination without abnormal findings: Secondary | ICD-10-CM

## 2022-07-02 ENCOUNTER — Other Ambulatory Visit (HOSPITAL_COMMUNITY): Payer: Self-pay

## 2022-07-07 ENCOUNTER — Other Ambulatory Visit (HOSPITAL_COMMUNITY): Payer: Self-pay

## 2022-07-09 ENCOUNTER — Other Ambulatory Visit (HOSPITAL_COMMUNITY): Payer: Self-pay

## 2022-07-25 ENCOUNTER — Other Ambulatory Visit (HOSPITAL_COMMUNITY): Payer: Self-pay

## 2022-07-26 ENCOUNTER — Other Ambulatory Visit (HOSPITAL_COMMUNITY): Payer: Self-pay

## 2022-07-30 ENCOUNTER — Other Ambulatory Visit (HOSPITAL_COMMUNITY): Payer: Self-pay

## 2022-08-08 ENCOUNTER — Other Ambulatory Visit (HOSPITAL_COMMUNITY): Payer: Self-pay

## 2022-08-11 ENCOUNTER — Other Ambulatory Visit (HOSPITAL_COMMUNITY): Payer: Self-pay

## 2022-08-28 ENCOUNTER — Other Ambulatory Visit (HOSPITAL_COMMUNITY): Payer: Self-pay

## 2022-09-04 ENCOUNTER — Other Ambulatory Visit (HOSPITAL_COMMUNITY): Payer: Self-pay

## 2022-09-15 ENCOUNTER — Other Ambulatory Visit (HOSPITAL_COMMUNITY): Payer: Self-pay

## 2022-09-17 ENCOUNTER — Other Ambulatory Visit (HOSPITAL_COMMUNITY): Payer: Self-pay

## 2022-09-24 ENCOUNTER — Other Ambulatory Visit (HOSPITAL_COMMUNITY): Payer: Self-pay

## 2022-09-24 ENCOUNTER — Telehealth: Payer: Self-pay

## 2022-09-24 NOTE — Telephone Encounter (Signed)
[facility] Patient Advocate Encounter  [facility] and I have been unsuccsessful in reaching patient to be able to refill medication.    We have tried multiple times without a response.  Daelyn Mozer, CPhT Specialty Pharmacy Patient Advocate [facility] Phone: [phone] Fax:  [phone]  

## 2022-09-26 ENCOUNTER — Other Ambulatory Visit (HOSPITAL_COMMUNITY): Payer: Self-pay

## 2022-09-29 ENCOUNTER — Other Ambulatory Visit (HOSPITAL_COMMUNITY): Payer: Self-pay

## 2022-09-30 ENCOUNTER — Other Ambulatory Visit (HOSPITAL_COMMUNITY): Payer: Self-pay

## 2022-09-30 ENCOUNTER — Ambulatory Visit: Payer: Self-pay | Admitting: Infectious Diseases

## 2022-10-07 ENCOUNTER — Other Ambulatory Visit (HOSPITAL_COMMUNITY): Payer: Self-pay

## 2022-10-16 ENCOUNTER — Other Ambulatory Visit (HOSPITAL_COMMUNITY): Payer: Self-pay

## 2022-10-22 ENCOUNTER — Other Ambulatory Visit: Payer: Self-pay

## 2022-10-22 ENCOUNTER — Other Ambulatory Visit (HOSPITAL_COMMUNITY): Payer: Self-pay

## 2022-10-23 ENCOUNTER — Other Ambulatory Visit (HOSPITAL_COMMUNITY): Payer: Self-pay

## 2022-10-24 ENCOUNTER — Other Ambulatory Visit: Payer: Self-pay

## 2022-11-18 ENCOUNTER — Other Ambulatory Visit: Payer: Self-pay | Admitting: Infectious Diseases

## 2022-11-18 ENCOUNTER — Other Ambulatory Visit (HOSPITAL_COMMUNITY): Payer: Self-pay

## 2022-11-18 ENCOUNTER — Other Ambulatory Visit: Payer: Self-pay | Admitting: Family

## 2022-11-18 ENCOUNTER — Other Ambulatory Visit: Payer: Self-pay

## 2022-11-18 DIAGNOSIS — Z21 Asymptomatic human immunodeficiency virus [HIV] infection status: Secondary | ICD-10-CM

## 2022-11-18 NOTE — Telephone Encounter (Signed)
Refused this rx because prescribed by Janene Madeira, NP with ID at [facility].

## 2022-11-19 ENCOUNTER — Other Ambulatory Visit: Payer: Self-pay

## 2022-11-19 ENCOUNTER — Other Ambulatory Visit (HOSPITAL_COMMUNITY): Payer: Self-pay

## 2022-11-19 ENCOUNTER — Telehealth: Payer: Self-pay

## 2022-11-19 MED ORDER — BIKTARVY 50-200-25 MG PO TABS
1.0000 | ORAL_TABLET | Freq: Every day | ORAL | 0 refills | Status: DC
  Filled 2022-11-19: qty 30, 30d supply, fill #0

## 2022-11-19 NOTE — Telephone Encounter (Signed)
Spoke with [employer] no longer works there. They will check and see if patient has a new case Freight forwarder. Asked them to please call us back with case manager's contact info.   Beryle Flock, RN

## 2022-11-19 NOTE — Telephone Encounter (Signed)
Called patient with [place] interpreter ([place] # [phone]). She needs an appointment with [facility] [place], provided her with our address and phone number.   She will call back after she speaks with her ride to get their availability and see when they can bring her. Asked her to call the front desk and request [place] interpreter.   Beryle Flock, RN

## 2022-11-20 ENCOUNTER — Other Ambulatory Visit (HOSPITAL_COMMUNITY): Payer: Self-pay

## 2022-12-16 ENCOUNTER — Other Ambulatory Visit (HOSPITAL_COMMUNITY): Payer: Self-pay

## 2022-12-17 DIAGNOSIS — J069 Acute upper respiratory infection, unspecified: Secondary | ICD-10-CM | POA: Insufficient documentation

## 2022-12-17 DIAGNOSIS — R058 Other specified cough: Secondary | ICD-10-CM | POA: Diagnosis not present

## 2022-12-17 DIAGNOSIS — Z1152 Encounter for screening for COVID-19: Secondary | ICD-10-CM | POA: Insufficient documentation

## 2022-12-22 ENCOUNTER — Other Ambulatory Visit: Payer: Self-pay | Admitting: Infectious Diseases

## 2022-12-22 ENCOUNTER — Other Ambulatory Visit (HOSPITAL_COMMUNITY): Payer: Self-pay

## 2022-12-22 ENCOUNTER — Other Ambulatory Visit: Payer: Self-pay

## 2022-12-22 DIAGNOSIS — Z21 Asymptomatic human immunodeficiency virus [HIV] infection status: Secondary | ICD-10-CM

## 2022-12-22 MED ORDER — BIKTARVY 50-200-25 MG PO TABS
1.0000 | ORAL_TABLET | Freq: Every day | ORAL | 0 refills | Status: DC
  Filled 2022-12-22: qty 30, 30d supply, fill #0

## 2023-01-05 ENCOUNTER — Other Ambulatory Visit (HOSPITAL_COMMUNITY): Payer: Self-pay

## 2023-01-06 ENCOUNTER — Other Ambulatory Visit (HOSPITAL_COMMUNITY): Payer: Self-pay

## 2023-01-08 ENCOUNTER — Other Ambulatory Visit (HOSPITAL_COMMUNITY): Payer: Self-pay

## 2023-01-29 ENCOUNTER — Other Ambulatory Visit (HOSPITAL_COMMUNITY): Payer: Self-pay

## 2023-02-02 ENCOUNTER — Other Ambulatory Visit (HOSPITAL_COMMUNITY): Payer: Self-pay

## 2023-02-04 ENCOUNTER — Other Ambulatory Visit (HOSPITAL_COMMUNITY): Payer: Self-pay

## 2023-02-04 ENCOUNTER — Other Ambulatory Visit: Payer: Self-pay | Admitting: Infectious Diseases

## 2023-02-04 DIAGNOSIS — Z21 Asymptomatic human immunodeficiency virus [HIV] infection status: Secondary | ICD-10-CM

## 2023-02-04 NOTE — Telephone Encounter (Signed)
Attempted to call pt to schedule appt. Not able to leave vm

## 2023-02-05 ENCOUNTER — Other Ambulatory Visit (HOSPITAL_COMMUNITY): Payer: Self-pay

## 2023-02-06 ENCOUNTER — Other Ambulatory Visit (HOSPITAL_COMMUNITY): Payer: Self-pay

## 2023-02-06 ENCOUNTER — Other Ambulatory Visit: Payer: Self-pay | Admitting: Infectious Diseases

## 2023-02-06 DIAGNOSIS — Z21 Asymptomatic human immunodeficiency virus [HIV] infection status: Secondary | ICD-10-CM

## 2023-02-09 ENCOUNTER — Other Ambulatory Visit (HOSPITAL_COMMUNITY): Payer: Self-pay

## 2023-02-09 MED ORDER — BIKTARVY 50-200-25 MG PO TABS
1.0000 | ORAL_TABLET | Freq: Every day | ORAL | 0 refills | Status: DC
  Filled 2023-02-09: qty 30, 30d supply, fill #0

## 2023-02-12 ENCOUNTER — Other Ambulatory Visit (HOSPITAL_COMMUNITY): Payer: Self-pay

## 2023-02-12 DIAGNOSIS — Z23 Encounter for immunization: Secondary | ICD-10-CM | POA: Diagnosis not present

## 2023-03-03 ENCOUNTER — Other Ambulatory Visit (HOSPITAL_COMMUNITY): Payer: Self-pay

## 2023-03-06 ENCOUNTER — Other Ambulatory Visit (HOSPITAL_COMMUNITY): Payer: Self-pay

## 2023-03-06 ENCOUNTER — Other Ambulatory Visit: Payer: Self-pay | Admitting: Infectious Diseases

## 2023-03-06 DIAGNOSIS — Z21 Asymptomatic human immunodeficiency virus [HIV] infection status: Secondary | ICD-10-CM

## 2023-03-09 ENCOUNTER — Other Ambulatory Visit: Payer: Self-pay

## 2023-03-09 ENCOUNTER — Other Ambulatory Visit (HOSPITAL_COMMUNITY): Payer: Self-pay

## 2023-03-09 ENCOUNTER — Telehealth: Payer: Self-pay

## 2023-03-09 MED ORDER — BIKTARVY 50-200-25 MG PO TABS
1.0000 | ORAL_TABLET | Freq: Every day | ORAL | 0 refills | Status: DC
Start: 2023-03-09 — End: 2023-04-07
  Filled 2023-03-09: qty 30, 30d supply, fill #0

## 2023-03-09 NOTE — Telephone Encounter (Signed)
-----   Message from Lucrezia Europe sent at 03/09/2023 11:41 AM EDT ----- I reached out with an interpreter. So, pt did not schedule. She stated that she works from 6a-6p. I continued to try to ask if there are days that she has off, but she said that she can maybe make it work in June. I offered days in June and she said she can "maybe" make it. At this point I was unable to schedule her, but the interpreter confirmed our # and she was going to call, for she would not set anything with me.  ----- Message ----- From: Sandie Ano, RN Sent: 03/09/2023   8:43 AM EDT To: Sheran Spine; Lucrezia Europe; Marin Shutter  Patient overdue for appointment, I don't think her case manager is working with her anymore. Could one of you reach out to her to schedule? I'll send in 30 days of meds. Thanks!

## 2023-03-17 ENCOUNTER — Other Ambulatory Visit (HOSPITAL_COMMUNITY): Payer: Self-pay

## 2023-03-18 ENCOUNTER — Other Ambulatory Visit (HOSPITAL_COMMUNITY): Payer: Self-pay

## 2023-03-26 ENCOUNTER — Other Ambulatory Visit (HOSPITAL_COMMUNITY): Payer: Self-pay

## 2023-04-03 ENCOUNTER — Other Ambulatory Visit: Payer: Self-pay

## 2023-04-03 NOTE — Congregational Nurse Program (Signed)
  Dept: [phone]   Congregational Nurse Program Note  Date of Encounter: 04/03/2023  Past Medical History: Past Medical History:  Diagnosis Date   HIV (human immunodeficiency virus infection) (HCC)    HIV (human immunodeficiency virus infection) (HCC) 02/26/2022    Encounter Details:  CNP Questionnaire - 04/03/23 1608       Questionnaire   Ask client: Do you give verbal consent for me to treat you today? Yes    Student Assistance N/A    Location Patient Served  [facility]    Visit Setting with Client Organization    Patient Status Refugee    Insurance Medicaid    Insurance/Financial Assistance Referral N/A    Medication Have Medication Insecurities    Medical Provider Yes    Medical Referrals Made [facility]    Medical Appointment Made [facility]    Recently w/o PCP, now 1st time PCP visit completed due to CNs referral or appointment made N/A    Food N/A    Transportation N/A    Housing/Utilities N/A    Interpersonal Safety N/A    Interventions Advocate/Support;Navigate Healthcare System;Case Management;Counsel;Educate    Abnormal to Normal Screening Since Last CN Visit N/A    Screenings CN Performed N/A    Sent Client to Lab for: N/A    Did client attend any of the following based off CNs referral or appointments made? N/A    ED Visit Averted Yes    Life-Saving Intervention Made N/A            Patient assisted to make a follow up appointment with infectious disease and to make new patient appointment with GYN for Implant removal.  Arman Bogus RN BSN PCCN  [facility] [phone]-cell [phone]-office

## 2023-04-07 ENCOUNTER — Other Ambulatory Visit: Payer: Self-pay

## 2023-04-07 ENCOUNTER — Other Ambulatory Visit: Payer: Self-pay | Admitting: Infectious Diseases

## 2023-04-07 ENCOUNTER — Telehealth: Payer: Self-pay

## 2023-04-07 ENCOUNTER — Ambulatory Visit: Payer: Self-pay | Admitting: Infectious Diseases

## 2023-04-07 ENCOUNTER — Other Ambulatory Visit (HOSPITAL_COMMUNITY): Payer: Self-pay

## 2023-04-07 DIAGNOSIS — Z21 Asymptomatic human immunodeficiency virus [HIV] infection status: Secondary | ICD-10-CM

## 2023-04-07 MED ORDER — BIKTARVY 50-200-25 MG PO TABS
1.0000 | ORAL_TABLET | Freq: Every day | ORAL | 0 refills | Status: DC
Start: 2023-04-07 — End: 2023-05-04
  Filled 2023-04-07: qty 30, 30d supply, fill #0

## 2023-04-07 NOTE — Telephone Encounter (Signed)
I have contacted patient regarding missed appointment today. Patient did not give any valid reason for missing appointment today.  Nicole Cella Lekendrick Alpern RN BSN PCCN  [facility] [phone] [phone]-office

## 2023-04-08 ENCOUNTER — Other Ambulatory Visit (HOSPITAL_COMMUNITY): Payer: Self-pay

## 2023-04-09 ENCOUNTER — Other Ambulatory Visit (HOSPITAL_COMMUNITY): Payer: Self-pay

## 2023-04-09 ENCOUNTER — Encounter (HOSPITAL_COMMUNITY): Payer: Self-pay | Admitting: Emergency Medicine

## 2023-04-09 ENCOUNTER — Telehealth: Payer: Self-pay

## 2023-04-09 ENCOUNTER — Ambulatory Visit (HOSPITAL_COMMUNITY)
Admission: EM | Admit: 2023-04-09 | Discharge: 2023-04-09 | Disposition: A | Discharge status: Home / Self Care | Payer: Medicaid Other | Attending: Internal Medicine | Admitting: Internal Medicine

## 2023-04-09 DIAGNOSIS — R0789 Other chest pain: Secondary | ICD-10-CM | POA: Diagnosis not present

## 2023-04-09 MED ORDER — TRAMADOL HCL 50 MG PO TABS
50.0000 mg | ORAL_TABLET | Freq: Four times a day (QID) | ORAL | 0 refills | Status: DC | PRN
  Filled 2023-04-09: qty 15, 4d supply, fill #0

## 2023-04-09 MED ORDER — METHOCARBAMOL 500 MG PO TABS
500.0000 mg | ORAL_TABLET | Freq: Every evening | ORAL | 0 refills | Status: DC | PRN
  Filled 2023-04-09: qty 20, 20d supply, fill #0

## 2023-04-09 NOTE — Discharge Instructions (Addendum)
Rest and elevate the affected painful area.   Apply cold compresses intermittently as needed.   Please take medication as directed. Please do not drive or operate heavy machinery after taking muscle relaxants because it makes you drowsy As pain recedes, begin normal activities slowly as tolerated.  Call if symptoms persist.

## 2023-04-09 NOTE — Telephone Encounter (Signed)
Patient informed that a follow up appointment is scheduled with Infectious disease on 05/04/23 at 10 am.  Arman Bogus RN BSN PCCN  [facility] [phone]-cell [phone]-office

## 2023-04-09 NOTE — ED Triage Notes (Signed)
Used interpretor  Pt c/o pain in right arm, chest and having difficulty breathing for 6 months. Reports work related and employer told needs to be seen and has paperwork filled out. Regarding work restrictions and limitations.   Taking ibuprofen for pain.

## 2023-04-10 ENCOUNTER — Ambulatory Visit: Payer: Self-pay | Admitting: *Deleted

## 2023-04-10 ENCOUNTER — Encounter: Payer: Self-pay | Admitting: Family

## 2023-04-10 NOTE — ED Provider Notes (Signed)
[facility]    CSN: [id number] Arrival date & time: 04/09/23  0857      History   Chief Complaint Chief Complaint  Patient presents with   Arm Pain    HPI Caitlin Marks is a 32 y.o. female comes to urgent care with right upper arm pain, right-sided chest pain and pain on taking a deep breath.  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  She describes the pain as sharp and throbbing and aggravated by movement with no known relieving factors.  Pain does not radiate into the upper extremity but rather in pain involves the right side of the chest and the right upper arm.  No falls or trauma.  No neck pain.  No weakness in the right upper extremity.  No numbness or tingling.  No weight loss or night sweats.   HPI  Past Medical History:  Diagnosis Date   HIV (human immunodeficiency virus infection) (HCC)    HIV (human immunodeficiency virus infection) (HCC) 02/26/2022    Patient Active Problem List   Diagnosis Date Noted   Elevated blood pressure reading 04/09/2022   Racing heart beat 04/09/2022   HIV (human immunodeficiency virus infection) (HCC) 02/26/2022   Healthcare maintenance 02/26/2022    Past Surgical History:  Procedure Laterality Date   NO PAST SURGERIES      OB History   No obstetric history on file.      Home Medications    Prior to Admission medications   Medication Sig Start Date End Date Taking? Authorizing Provider  methocarbamol (ROBAXIN) 500 MG tablet Take 1 tablet (500 mg total) by mouth at bedtime as needed for muscle spasms. 04/09/23  Yes Amberlin Utke, Britta Mccreedy, MD  traMADol (ULTRAM) 50 MG tablet Take 1 tablet (50 mg total) by mouth every 6 (six) hours as needed. 04/09/23  Yes Keimya Briddell, Britta Mccreedy, MD  bictegravir-emtricitabine-tenofovir AF (BIKTARVY) 50-200-25 MG TABS tablet Take 1 tablet by mouth daily. Try to take at the same time each day with or without food. 04/07/23   Blanchard Kelch, NP    Family History Family History  Problem Relation Age of Onset   Other Mother    Diabetes Neg Hx    Cancer Neg Hx     Social History Social History   Tobacco Use   Smoking status: Former    Types: Cigarettes    Passive exposure: Past   Smokeless tobacco: Never  Vaping Use   Vaping Use: Never used  Substance Use Topics   Alcohol use: Yes    Comment: occasionally   Drug use: Never     Allergies   Patient has no known allergies.   Review of Systems Review of Systems As per HPI  Physical Exam Triage Vital Signs ED Triage Vitals [04/09/23 1031]  Enc Vitals Group     BP (!) 159/107     Pulse Rate (!) 101     Resp 16     Temp 98 F (36.7 C)     Temp Source Oral     SpO2 98 %     Weight      Height      Head Circumference      Peak Flow      Pain Score      Pain Loc      Pain Edu?      Excl. in GC?    No  data found.  Updated Vital Signs BP (!) 159/107 (BP Location: Left Arm)   Pulse (!) 101   Temp 98 F (36.7 C) (Oral)   Resp 16   LMP 03/30/2023   SpO2 98%   Visual Acuity Right Eye Distance:   Left Eye Distance:   Bilateral Distance:    Right Eye Near:   Left Eye Near:    Bilateral Near:     Physical Exam Vitals and nursing note reviewed.  Constitutional:      General: She is not in acute distress.    Appearance: She is not ill-appearing.  Cardiovascular:     Rate and Rhythm: Normal rate and regular rhythm.     Pulses: Normal pulses.     Heart sounds: Normal heart sounds.  Pulmonary:     Effort: Pulmonary effort is normal.     Comments: Pectoralis major tenderness on palpation.  No bruising noted.  Full range of motion of the right shoulder. Abdominal:     General: Bowel sounds are normal.     Palpations: Abdomen is soft.  Musculoskeletal:     Cervical back: Normal range of motion.  Neurological:     Mental Status: She is alert.      UC Treatments / Results  Labs (all labs ordered are listed, but only  abnormal results are displayed) Labs Reviewed - No data to display  EKG   Radiology No results found.  Procedures Procedures (including critical care time)  Medications Ordered in UC Medications - No data to display  Initial Impression / Assessment and Plan / UC Course  I have reviewed the triage vital signs and the nursing notes.  Pertinent labs & imaging results that were available during my care of the patient were reviewed by me and considered in my medical decision making (see chart for details).     1.  Musculoskeletal chest wall pain: Tramadol 50 mg every 6 hours as needed for pain Robaxin at bedtime as needed Gentle range of motion exercises Cold compress as needed No indication for imaging Return precautions given. Final Clinical Impressions(s) / UC Diagnoses   Final diagnoses:  Musculoskeletal chest pain     Discharge Instructions      Rest and elevate the affected painful area.   Apply cold compresses intermittently as needed.   Please take medication as directed. Please do not drive or operate heavy machinery after taking muscle relaxants because it makes you drowsy As pain recedes, begin normal activities slowly as tolerated.  Call if symptoms persist.    ED Prescriptions     Medication Sig Dispense Auth. Provider   methocarbamol (ROBAXIN) 500 MG tablet Take 1 tablet (500 mg total) by mouth at bedtime as needed for muscle spasms. 20 tablet Brynlea Spindler, Britta Mccreedy, MD   traMADol (ULTRAM) 50 MG tablet Take 1 tablet (50 mg total) by mouth every 6 (six) hours as needed. 15 tablet Derron Pipkins, Britta Mccreedy, MD      I have reviewed the PDMP during this encounter.   Merrilee Jansky, MD 04/10/23 1314

## 2023-04-10 NOTE — Telephone Encounter (Signed)
Patient's brother called due to language barrier and when asked his name he would not share information. Called patient back using interpreter ID # [phone] to talk with patient. Brother not on DPR Chief Complaint: chest pain , right shoulder/ arm pain Symptoms: see above. C/o breathing in causes right arm chest pain requesting paperwork to be completed due to job issues. Afraid to lose job Frequency: greater than 1 year Pertinent Negatives: Patient denies chest pain constant no nausea no sweating  Disposition: [] ED /[] Urgent Care (no appt availability in office) / [x] Appointment(In office/virtual)/ []  [facility]/ [] Home Care/ [] Refused Recommended Disposition /[] [facility]/ []  Follow-up with PCP Additional Notes:   Appt scheduled 04/15/23. Recommended if pain worsens go back to ED.     Reason for Disposition  [1] Chest pain from known angina comes and goes AND [2] is NOT happening more often (increasing in frequency) or getting worse (increasing in severity)  Answer Assessment - Initial Assessment Questions 1. LOCATION: "Where does it hurt?"       Below right chest above ribs 2. RADIATION: "Does the pain go anywhere else?" (e.g., into neck, jaw, arms, back)     Arm shoulder and back  3. ONSET: "When did the chest pain begin?" (Minutes, hours or days)      Since started working did not give a time approx. 1 year  4. PATTERN: "Does the pain come and go, or has it been constant since it started?"  "Does it get worse with exertion?"      Comes and goes  5. DURATION: "How long does it last" (e.g., seconds, minutes, hours)     Na  6. SEVERITY: "How bad is the pain?"  (e.g., Scale 1-10; mild, moderate, or severe)    - MILD (1-3): doesn't interfere with normal activities     - MODERATE (4-7): interferes with normal activities or awakens from sleep    - SEVERE (8-10): excruciating pain, unable to do any normal activities       Worsening pain last seen in ED 04/09/23 7.  CARDIAC RISK FACTORS: "Do you have any history of heart problems or risk factors for heart disease?" (e.g., angina, prior heart attack; diabetes, high blood pressure, high cholesterol, smoker, or strong family history of heart disease)     See hx  8. PULMONARY RISK FACTORS: "Do you have any history of lung disease?"  (e.g., blood clots in lung, asthma, emphysema, birth control pills)     na 9. CAUSE: "What do you think is causing the chest pain?"     Not sure  10. OTHER SYMPTOMS: "Do you have any other symptoms?" (e.g., dizziness, nausea, vomiting, sweating, fever, difficulty breathing, cough)       Pain in shoulder, chest, right side  11. PREGNANCY: "Is there any chance you are pregnant?" "When was your last menstrual period?"       na  Protocols used: Chest Pain-A-AH

## 2023-04-14 ENCOUNTER — Other Ambulatory Visit (HOSPITAL_COMMUNITY): Payer: Self-pay

## 2023-04-14 ENCOUNTER — Other Ambulatory Visit: Payer: Self-pay | Admitting: Family

## 2023-04-14 NOTE — Progress Notes (Signed)
Patient ID: Caitlin Marks, female    DOB: 03-24-1991  MRN: [id number]  CC: Urgent Care Follow-Up  Subjective: Caitlin Marks is a 32 y.o. female who presents for Urgent Care follow-up. She is accompanied by her brother.  Her concerns today include:  04/09/2023 [facility] per MD note: Initial Impression / Assessment and Plan / UC Course  I have reviewed the triage vital signs and the nursing notes.   Pertinent labs & imaging results that were available during my care of the patient were reviewed by me and considered in my medical decision making (see chart for details).     1.  Musculoskeletal chest wall pain: Tramadol 50 mg every 6 hours as needed for pain Robaxin at bedtime as needed Gentle range of motion exercises Cold compress as needed No indication for imaging Return precautions given.  04/10/2023 per triage RN call note: Patient's brother called due to language barrier and when asked his name he would not share information. Called patient back using interpreter ID # [phone] to talk with patient. Brother not on DPR Chief Complaint: chest pain , right shoulder/ arm pain Symptoms: see above. C/o breathing in causes right arm chest pain requesting paperwork to be completed due to job issues. Afraid to lose job Frequency: greater than 1 year Pertinent Negatives: Patient denies chest pain constant no nausea no sweating  Disposition: [] ED /[] Urgent Care (no appt availability in office) / [x] Appointment(In office/virtual)/ []  [facility]/ [] Home Care/ [] Refused Recommended Disposition /[]  Mobile Bus/ []  Follow-up with PCP Additional Notes:    Appt scheduled 04/15/23. Recommended if pain worsens go back to ED.       Reason for Disposition  [1] Chest pain from known angina comes and goes AND [2] is NOT happening more often (increasing in frequency) or getting worse (increasing in severity)  Answer Assessment - Initial Assessment  Questions 1. LOCATION: "Where does it hurt?"       Below right chest above ribs 2. RADIATION: "Does the pain go anywhere else?" (e.g., into neck, jaw, arms, back)     Arm shoulder and back  3. ONSET: "When did the chest pain begin?" (Minutes, hours or days)      Since started working did not give a time approx. 1 year  4. PATTERN: "Does the pain come and go, or has it been constant since it started?"  "Does it get worse with exertion?"      Comes and goes  5. DURATION: "How long does it last" (e.g., seconds, minutes, hours)     Na  6. SEVERITY: "How bad is the pain?"  (e.g., Scale 1-10; mild, moderate, or severe)    - MILD (1-3): doesn't interfere with normal activities     - MODERATE (4-7): interferes with normal activities or awakens from sleep    - SEVERE (8-10): excruciating pain, unable to do any normal activities       Worsening pain last seen in ED 04/09/23 7. CARDIAC RISK FACTORS: "Do you have any history of heart problems or risk factors for heart disease?" (e.g., angina, prior heart attack; diabetes, high blood pressure, high cholesterol, smoker, or strong family history of heart disease)     See hx  8. PULMONARY RISK FACTORS: "Do you have any history of lung disease?"  (e.g., blood clots in lung, asthma, emphysema, birth control pills)     na 9. CAUSE: "What do you think is causing the chest pain?"  Not sure  10. OTHER SYMPTOMS: "Do you have any other symptoms?" (e.g., dizziness, nausea, vomiting, sweating, fever, difficulty breathing, cough)       Pain in shoulder, chest, right side  11. PREGNANCY: "Is there any chance you are pregnant?" "When was your last menstrual period?"       na  Protocols used: Chest Pain-A-AH  Today's visit 04/15/2023: - Reports chronic right shoulder and arm pain persisting x 6 months. Radiates to chest. Pain medication prescribed at Urgent Care with minimal improvement. She denies recent trauma/injury. She is an Human resources officer at [employer]. Job title  [employer]. Patient presents with Medical and Physical Capabilities Assessment form for completion from her employer. Patient reports her right shoulder/arm pain is related to her job and that she cannot lift greater than 10 pounds. She would like for like for primary provider to make her employer aware of lifting restriction. Patient reports that she would like for her employer to give her another job role where she does not have to lift but only "close boxes" Also, patient requests work letter to remain out of work beginning today and to return on 04/27/2023.  - Blood pressure check. She does not check her blood pressure outside of office. The patient does not complain of red flag symptoms such as but not limited to chest pain, shortness of breath, worst headache of life, nausea/vomiting.  - No further issues/concerns for discussion today.   Patient Active Problem List   Diagnosis Date Noted   Primary hypertension 04/15/2023   Elevated blood pressure reading 04/09/2022   Racing heart beat 04/09/2022   HIV (human immunodeficiency virus infection) (HCC) 02/26/2022   Healthcare maintenance 02/26/2022     Current Outpatient Medications on File Prior to Visit  Medication Sig Dispense Refill   bictegravir-emtricitabine-tenofovir AF (BIKTARVY) 50-200-25 MG TABS tablet Take 1 tablet by mouth daily. Try to take at the same time each day with or without food. 30 tablet 0   methocarbamol (ROBAXIN) 500 MG tablet Take 1 tablet (500 mg total) by mouth at bedtime as needed for muscle spasms. 20 tablet 0   traMADol (ULTRAM) 50 MG tablet Take 1 tablet (50 mg total) by mouth every 6 (six) hours as needed. 15 tablet 0   No current facility-administered medications on file prior to visit.    No Known Allergies  Social History   Socioeconomic History   Marital status: Single    Spouse name: Not on file   Number of children: Not on file   Years of education: Not on file   Highest education  level: Not on file  Occupational History   Not on file  Tobacco Use   Smoking status: Former    Types: Cigarettes    Passive exposure: Past   Smokeless tobacco: Never  Vaping Use   Vaping Use: Never used  Substance and Sexual Activity   Alcohol use: Yes    Comment: occasionally   Drug use: Never   Sexual activity: Not on file    Comment: condoms accepted  Other Topics Concern   Not on file  Social History Narrative   Not on file   Social Determinants of Health   Financial Resource Strain: Not on file  Food Insecurity: Not on file  Transportation Needs: Not on file  Physical Activity: Not on file  Stress: Not on file  Social Connections: Not on file  Intimate Partner Violence: Not on file    Family History  Problem Relation Age  of Onset   Other Mother    Diabetes Neg Hx    Cancer Neg Hx     Past Surgical History:  Procedure Laterality Date   NO PAST SURGERIES      ROS: Review of Systems Negative except as stated above  PHYSICAL EXAM: BP (!) 158/112   Pulse 94   Temp 98.1 F (36.7 C) (Oral)   Resp 16   Wt 166 lb 6.4 oz (75.5 kg)   LMP 03/30/2023   SpO2 98%   BMI 32.67 kg/m   Physical Exam HENT:     Head: Normocephalic and atraumatic.     Nose: Nose normal.     Mouth/Throat:     Mouth: Mucous membranes are moist.     Pharynx: Oropharynx is clear.  Eyes:     Extraocular Movements: Extraocular movements intact.     Conjunctiva/sclera: Conjunctivae normal.     Pupils: Pupils are equal, round, and reactive to light.  Cardiovascular:     Rate and Rhythm: Normal rate and regular rhythm.     Pulses: Normal pulses.     Heart sounds: Normal heart sounds.  Pulmonary:     Effort: Pulmonary effort is normal.     Breath sounds: Normal breath sounds.  Musculoskeletal:     Right shoulder: Tenderness present.     Left shoulder: Normal.     Right upper arm: Normal.     Left upper arm: Normal.     Right elbow: Normal.     Left elbow: Normal.     Right  forearm: Normal.     Left forearm: Normal.     Right wrist: Normal.     Left wrist: Normal.     Right hand: Normal.     Left hand: Normal.     Cervical back: Normal, normal range of motion and neck supple.     Thoracic back: Normal.     Lumbar back: Normal.     Right hip: Normal.     Left hip: Normal.     Right upper leg: Normal.     Left upper leg: Normal.     Right knee: Normal.     Left knee: Normal.     Right lower leg: Normal.     Left lower leg: Normal.     Right ankle: Normal.     Left ankle: Normal.     Right foot: Normal.     Left foot: Normal.  Neurological:     General: No focal deficit present.     Mental Status: She is alert and oriented to person, place, and time.  Psychiatric:        Mood and Affect: Mood normal.        Behavior: Behavior normal.     ASSESSMENT AND PLAN: 1. Chronic right shoulder pain 2. Musculoskeletal chest pain - Continue present management.  - Patient provided with work letter to return to work on 04/27/2023 with no heavy lifting greater than 10 pounds. - Referral to Orthopedics for further evaluation/management. During the interim follow-up with primary provider as scheduled. I discussed with patient in detail Medical and Physical Capabilities Assessment form to be completed by Orthopedics. Patient verbalized understanding/agreement. - Ambulatory referral to Orthopedics  3. Primary hypertension - New onset.  - Blood pressure not at goal during today's visit. Patient asymptomatic without chest pressure, chest pain, palpitations, shortness of breath, worst headache of life, and any additional red flag symptoms. - Begin Valsartan as prescribed. Counseled on medication  adherence/adverse effects. - Routine screening.  - Counseled on blood pressure goal of less than 130/80, low-sodium, DASH diet, medication compliance, and 150 minutes of moderate intensity exercise per week as tolerated. Counseled on medication adherence and adverse effects. -  Follow-up with primary provider in 2 weeks or sooner if needed.  - valsartan (DIOVAN) 40 MG tablet; Take 1 tablet (40 mg) by mouth daily.  Dispense: 30 tablet; Refill: 1 - CMP14+EGFR  4. Diabetes mellitus screening - Routine screening.  - Hemoglobin A1c  5. Screening cholesterol level - Routine screening.  - Lipid panel  6. Language barrier - [employer]. Name: Jeanette Caprice  ID#: [id number]     Patient was given the opportunity to ask questions.  Patient verbalized understanding of the plan and was able to repeat key elements of the plan. Patient was given clear instructions to go to Emergency Department or return to medical center if symptoms don't improve, worsen, or new problems develop.The patient verbalized understanding.   Orders Placed This Encounter  Procedures   Lipid panel   Hemoglobin A1c   CMP14+EGFR   Ambulatory referral to Orthopedics     Requested Prescriptions   Signed Prescriptions Disp Refills   valsartan (DIOVAN) 40 MG tablet 30 tablet 1    Sig: Take 1 tablet (40 mg) by mouth daily.    Return in about 2 weeks (around 04/29/2023) for Follow-Up or next available blood pressure check.  Rema Fendt, NP

## 2023-04-15 ENCOUNTER — Encounter: Payer: Self-pay | Admitting: Family

## 2023-04-15 ENCOUNTER — Ambulatory Visit (INDEPENDENT_AMBULATORY_CARE_PROVIDER_SITE_OTHER): Payer: Medicaid Other | Admitting: Family

## 2023-04-15 ENCOUNTER — Other Ambulatory Visit (HOSPITAL_COMMUNITY): Payer: Self-pay

## 2023-04-15 VITALS — BP 158/112 | HR 94 | Temp 98.1°F | Resp 16 | Wt 166.4 lb

## 2023-04-15 DIAGNOSIS — Z131 Encounter for screening for diabetes mellitus: Secondary | ICD-10-CM

## 2023-04-15 DIAGNOSIS — G8929 Other chronic pain: Secondary | ICD-10-CM | POA: Diagnosis not present

## 2023-04-15 DIAGNOSIS — I10 Essential (primary) hypertension: Secondary | ICD-10-CM | POA: Diagnosis not present

## 2023-04-15 DIAGNOSIS — Z758 Other problems related to medical facilities and other health care: Secondary | ICD-10-CM

## 2023-04-15 DIAGNOSIS — M25511 Pain in right shoulder: Secondary | ICD-10-CM | POA: Diagnosis not present

## 2023-04-15 DIAGNOSIS — R0789 Other chest pain: Secondary | ICD-10-CM

## 2023-04-15 DIAGNOSIS — Z603 Acculturation difficulty: Secondary | ICD-10-CM | POA: Diagnosis not present

## 2023-04-15 DIAGNOSIS — Z13220 Encounter for screening for lipoid disorders: Secondary | ICD-10-CM

## 2023-04-15 MED ORDER — VALSARTAN 40 MG PO TABS
40.0000 mg | ORAL_TABLET | Freq: Every day | ORAL | 1 refills | Status: DC
Start: 2023-04-15 — End: 2023-10-29
  Filled 2023-04-15 (×2): qty 30, 30d supply, fill #0
  Filled 2023-05-04: qty 30, 30d supply, fill #1

## 2023-04-15 NOTE — Progress Notes (Signed)
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 Patient said she would like her position change at work due to pain in arm and chest. - Patient would also like to work in another area where it is not cold -medication refill BP

## 2023-04-16 ENCOUNTER — Other Ambulatory Visit: Payer: Self-pay | Admitting: Family

## 2023-04-16 ENCOUNTER — Telehealth: Payer: Self-pay | Admitting: *Deleted

## 2023-04-16 DIAGNOSIS — Z13228 Encounter for screening for other metabolic disorders: Secondary | ICD-10-CM

## 2023-04-16 LAB — CMP14+EGFR
ALT: 31 IU/L (ref 0–32)
AST: 45 IU/L — ABNORMAL HIGH (ref 0–40)
Albumin/Globulin Ratio: 1.3 (ref 1.2–2.2)
Albumin: 4.3 g/dL (ref 4.0–5.0)
Alkaline Phosphatase: 72 IU/L (ref 44–121)
BUN/Creatinine Ratio: 7 — ABNORMAL LOW (ref 9–23)
BUN: 8 mg/dL (ref 6–20)
Bilirubin Total: 0.3 mg/dL (ref 0.0–1.2)
CO2: 25 mmol/L (ref 20–29)
Calcium: 9.2 mg/dL (ref 8.7–10.2)
Chloride: 103 mmol/L (ref 96–106)
Creatinine, Ser: 1.07 mg/dL — ABNORMAL HIGH (ref 0.57–1.00)
Globulin, Total: 3.3 g/dL (ref 1.5–4.5)
Glucose: 86 mg/dL (ref 70–99)
Potassium: 4.5 mmol/L (ref 3.5–5.2)
Sodium: 140 mmol/L (ref 134–144)
Total Protein: 7.6 g/dL (ref 6.0–8.5)
eGFR: 72 mL/min/{1.73_m2} (ref >59)

## 2023-04-16 LAB — HEMOGLOBIN A1C
Est. average glucose Bld gHb Est-mCnc: 114 mg/dL
Hgb A1c MFr Bld: 5.6 % (ref 4.8–5.6)

## 2023-04-16 LAB — LIPID PANEL
Chol/HDL Ratio: 2.8 ratio (ref 0.0–4.4)
Cholesterol, Total: 144 mg/dL (ref 100–199)
HDL: 52 mg/dL (ref >39)
LDL Chol Calc (NIH): 79 mg/dL (ref 0–99)
Triglycerides: 64 mg/dL (ref 0–149)
VLDL Cholesterol Cal: 13 mg/dL (ref 5–40)

## 2023-04-16 NOTE — Telephone Encounter (Signed)
Patient Film/video editor, Caitlin Marks called and was asking if provider,Amy is willing to fill out a form stating patient can not lift over 10/20 lbs. I explain that provider had stated that our office is not equip to asset lifting abilities for patient.Provider did referral patient to [facility] for further evaluation of lifting.

## 2023-04-28 ENCOUNTER — Other Ambulatory Visit (INDEPENDENT_AMBULATORY_CARE_PROVIDER_SITE_OTHER): Payer: Medicaid Other

## 2023-04-28 ENCOUNTER — Ambulatory Visit: Payer: Medicaid Other | Admitting: Physician Assistant

## 2023-04-28 ENCOUNTER — Encounter: Payer: Self-pay | Admitting: Physician Assistant

## 2023-04-28 ENCOUNTER — Ambulatory Visit (INDEPENDENT_AMBULATORY_CARE_PROVIDER_SITE_OTHER): Payer: Medicaid Other | Admitting: Physician Assistant

## 2023-04-28 DIAGNOSIS — M25511 Pain in right shoulder: Secondary | ICD-10-CM | POA: Diagnosis not present

## 2023-04-28 DIAGNOSIS — G8929 Other chronic pain: Secondary | ICD-10-CM

## 2023-04-28 NOTE — Progress Notes (Signed)
Office Visit Note   Patient: Caitlin Marks           Date of Birth: 1990-11-25           MRN: [id number] Visit Date: 04/28/2023              Requested by: Rema Fendt, NP [address] [place],  [place] [address] PCP: Rema Fendt, NP  Chief Complaint  Patient presents with   Right Shoulder - Pain      HPI: Patient is a pleasant 32 year old woman who is seen with the assistance of an interpreter today.  She has a 59-month history of muscular chest wall pain and right shoulder pain.  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  She had no particular injury but says the repetitiveness of this has caused her pain.  Hurts with overhead activity denies any radicular symptoms.  She was seen in an urgent care who did not do x-rays and felt this was all muscular.  They did put her on a muscle relaxant.  She comes in today because she needs work restrictions through her for her employer.  Assessment & Plan: Visit Diagnoses:  1. Chronic right shoulder pain     Plan: X-rays were benign.  She does not have any significant cervical or shoulder pathology.  I do think this is all muscular in general.  I will restrict her work limitation in lifting for the next 6 weeks.  I do think she should engage in physical therapy and she is willing to do this.  Will follow-up with her in 6 weeks.  Follow-Up Instructions: 6 weeks  Ortho Exam  Patient is alert, oriented, no adenopathy, well-dressed, normal affect, normal respiratory effort. Right shoulder: She can overhead her arm but does have some pain with this strength is 5 out of 5 with external and internal rotation of her arm resisted abduction grip strength.  She has no paresthesias.  She has a mildly positive speeds and empty can tests no apprehension she does have palpable tenderness over the chest wall.  No shortness of breath no dyspnea.  Imaging: No results found. No images are attached to the  encounter.  Labs: Lab Results  Component Value Date   HGBA1C 5.6 04/15/2023   HGBA1C 5.2 04/09/2022     Lab Results  Component Value Date   ALBUMIN 4.3 04/15/2023    No results found for: "MG" No results found for: "VD25OH"  No results found for: "PREALBUMIN"    Latest Ref Rng & Units 02/26/2022   11:04 AM  CBC EXTENDED  WBC 3.4 - 10.8 x10E3/uL 7.7   RBC 3.77 - 5.28 x10E6/uL 5.21   Hemoglobin 11.1 - 15.9 g/dL 19.1   HCT 47.8 - 29.5 % 46.9   Platelets 150 - 450 x10E3/uL 302   NEUT# 1.4 - 7.0 x10E3/uL 5.1   Lymph# 0.7 - 3.1 x10E3/uL 2.1      There is no height or weight on file to calculate BMI.  Orders:  Orders Placed This Encounter  Procedures   XR Cervical Spine 2 or 3 views   XR Shoulder Right   No orders of the defined types were placed in this encounter.    Procedures: No procedures performed  Clinical Data: No additional findings.  ROS:  All other systems negative, except as noted in the HPI. Review of Systems  Objective: Vital Signs: LMP 03/30/2023   Specialty Comments:  No  specialty comments available.  PMFS History: Patient Active Problem List   Diagnosis Date Noted   Primary hypertension 04/15/2023   Elevated blood pressure reading 04/09/2022   Racing heart beat 04/09/2022   HIV (human immunodeficiency virus infection) (HCC) 02/26/2022   Healthcare maintenance 02/26/2022   Past Medical History:  Diagnosis Date   HIV (human immunodeficiency virus infection) (HCC)    HIV (human immunodeficiency virus infection) (HCC) 02/26/2022    Family History  Problem Relation Age of Onset   Other Mother    Diabetes Neg Hx    Cancer Neg Hx     Past Surgical History:  Procedure Laterality Date   NO PAST SURGERIES     Social History   Occupational History   Not on file  Tobacco Use   Smoking status: Former    Types: Cigarettes    Passive exposure: Past   Smokeless tobacco: Never  Vaping Use   Vaping Use: Never used  Substance and  Sexual Activity   Alcohol use: Yes    Comment: occasionally   Drug use: Never   Sexual activity: Not on file    Comment: condoms accepted

## 2023-04-29 ENCOUNTER — Telehealth: Payer: Self-pay | Admitting: Physician Assistant

## 2023-04-29 ENCOUNTER — Telehealth: Payer: Self-pay

## 2023-04-29 ENCOUNTER — Ambulatory Visit: Payer: Medicaid Other | Admitting: Physician Assistant

## 2023-04-29 ENCOUNTER — Other Ambulatory Visit (HOSPITAL_COMMUNITY): Payer: Self-pay

## 2023-04-29 NOTE — Congregational Nurse Program (Signed)
  Dept: [phone]   Congregational Nurse Program Note  Date of Encounter: 04/07/2023  Past Medical History: Past Medical History:  Diagnosis Date   HIV (human immunodeficiency virus infection) (HCC)    HIV (human immunodeficiency virus infection) (HCC) 02/26/2022    Encounter Details:  CNP Questionnaire - 04/29/23 1326       Questionnaire   Ask client: Do you give verbal consent for me to treat you today? Yes    Student Assistance N/A    Location Patient Served  [facility]    Visit Setting with Client Organization    Patient Status Refugee    Insurance Medicaid    Insurance/Financial Assistance Referral N/A    Medication Have Medication Insecurities    Medical Provider Yes    Screening Referrals Made N/A    Medical Referrals Made [facility]    Medical Appointment Made [facility]    Recently w/o PCP, now 1st time PCP visit completed due to CNs referral or appointment made N/A    Food N/A    Transportation N/A    Housing/Utilities N/A    Interpersonal Safety N/A    Interventions Advocate/Support;Navigate Healthcare System;Case Management;Counsel;Educate    Abnormal to Normal Screening Since Last CN Visit N/A    Screenings CN Performed N/A    Sent Client to Lab for: N/A    Did client attend any of the following based off CNs referral or appointments made? N/A    ED Visit Averted Yes    Life-Saving Intervention Made N/A            Patient called with appointment reminder.  Nicole Cella Sayda Grable RN BSN PCCN  [facility] [phone] [phone]-office

## 2023-04-29 NOTE — Telephone Encounter (Signed)
Patient called with appointment reminder  Carles Florea RN BSN PCCN  [facility] [phone]-cell [phone]-office  

## 2023-04-29 NOTE — Telephone Encounter (Signed)
FMLA forms received. To [employer]. 

## 2023-05-01 ENCOUNTER — Other Ambulatory Visit (HOSPITAL_COMMUNITY): Payer: Self-pay

## 2023-05-04 ENCOUNTER — Other Ambulatory Visit (HOSPITAL_COMMUNITY): Payer: Self-pay

## 2023-05-04 ENCOUNTER — Encounter: Payer: Self-pay | Admitting: Infectious Diseases

## 2023-05-04 ENCOUNTER — Ambulatory Visit (INDEPENDENT_AMBULATORY_CARE_PROVIDER_SITE_OTHER): Payer: Medicaid Other | Admitting: Infectious Diseases

## 2023-05-04 ENCOUNTER — Other Ambulatory Visit: Payer: Self-pay

## 2023-05-04 VITALS — BP 148/110 | HR 96 | Temp 98.0°F | Resp 16 | Wt 162.0 lb

## 2023-05-04 DIAGNOSIS — I10 Essential (primary) hypertension: Secondary | ICD-10-CM

## 2023-05-04 DIAGNOSIS — Z113 Encounter for screening for infections with a predominantly sexual mode of transmission: Secondary | ICD-10-CM | POA: Diagnosis not present

## 2023-05-04 DIAGNOSIS — Z0000 Encounter for general adult medical examination without abnormal findings: Secondary | ICD-10-CM | POA: Diagnosis not present

## 2023-05-04 DIAGNOSIS — Z21 Asymptomatic human immunodeficiency virus [HIV] infection status: Secondary | ICD-10-CM

## 2023-05-04 LAB — CBC
Platelets: 230 10*3/uL (ref 140–400)
RBC: 4.85 10*6/uL (ref 3.80–5.10)
WBC: 5 10*3/uL (ref 3.8–10.8)

## 2023-05-04 MED ORDER — BIKTARVY 50-200-25 MG PO TABS
1.0000 | ORAL_TABLET | Freq: Every day | ORAL | 11 refills | Status: DC
Start: 2023-05-04 — End: 2024-04-07
  Filled 2023-05-04 (×2): qty 30, 30d supply, fill #0
  Filled 2023-06-02: qty 30, 30d supply, fill #1
  Filled 2023-06-24: qty 30, 30d supply, fill #2
  Filled 2023-07-22: qty 30, 30d supply, fill #3
  Filled 2023-08-25: qty 30, 30d supply, fill #4
  Filled 2023-09-14: qty 30, 30d supply, fill #5
  Filled 2023-10-07: qty 30, 30d supply, fill #6
  Filled 2023-11-26: qty 30, 30d supply, fill #7
  Filled 2023-12-18: qty 30, 30d supply, fill #8
  Filled 2024-01-08: qty 30, 30d supply, fill #9
  Filled 2024-02-04: qty 30, 30d supply, fill #10
  Filled 2024-03-10: qty 30, 30d supply, fill #11

## 2023-05-04 NOTE — Progress Notes (Signed)
Name: Caitlin Marks  DOB: 09-Feb-1991 MRN: [id number] PCP: Rema Fendt, NP     Brief Narrative:  Caitlin Marks is a 32 y.o. female with HIV. Dx 2018. 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     HPI: Swahili interpretor on the phone today via [employer].   She was last seen in May 2023 - she is doing well and reports no concerns she has.   Her blood pressure is a little high today  She has been getting and taking her biktarvy fine wihtout any trouble.   She has not been able to get her fill from the pharmacy yet. Does not recall any missed calls. They discussed that they may do better picking up from pharmacy in person.   She has no interest in any preventative vaccines. No plans for any children at present - she would eventually like to have 1 more but not right now. She had one pap smear done prior to coming to the [place] (2 years ago) - she reports that it was normal.       05/04/2023    9:38 AM  Depression screen PHQ 2/9  Decreased Interest 0  Down, Depressed, Hopeless 0  PHQ - 2 Score 0    Review of Systems  Constitutional:  Negative for chills, fever, malaise/fatigue and weight loss.  HENT:  Negative for sore throat.   Respiratory:  Negative for cough, sputum production and shortness of breath.   Cardiovascular:  Positive for palpitations. Negative for chest pain, orthopnea and leg swelling.  Gastrointestinal:  Negative for abdominal pain, diarrhea and vomiting.  Musculoskeletal:  Negative for joint pain, myalgias and neck pain.  Skin:  Negative for rash.   Neurological:  Negative for headaches.  Psychiatric/Behavioral:  Negative for depression and substance abuse. The patient is not nervous/anxious.      Past Medical History:  Diagnosis Date   HIV (human immunodeficiency virus infection) (HCC)    HIV (human immunodeficiency virus infection) (HCC) 02/26/2022    Outpatient Medications Prior to Visit  Medication Sig Dispense Refill   methocarbamol (ROBAXIN) 500 MG tablet Take 1 tablet (500 mg total) by mouth at bedtime as needed for muscle spasms. 20 tablet 0   traMADol (ULTRAM) 50 MG tablet Take 1 tablet (50 mg total) by mouth every 6 (six) hours as needed. 15 tablet 0   valsartan (DIOVAN) 40 MG tablet Take 1 tablet (40 mg) by mouth daily. 30 tablet 1   bictegravir-emtricitabine-tenofovir AF (BIKTARVY) 50-200-25 MG TABS tablet Take 1 tablet by mouth daily. Try to take at the same time each day with or without food. 30 tablet 0   No facility-administered medications prior to visit.     No Known Allergies  Social History   Tobacco Use   Smoking status: Former    Types: Cigarettes    Passive exposure: Past   Smokeless tobacco: Never  Vaping Use   Vaping Use: Never used  Substance Use Topics   Alcohol use: Yes    Comment: occasionally   Drug use: Never  Family History  Problem Relation Age of Onset   Other Mother    Diabetes Neg Hx    Cancer Neg Hx     Social History   Substance and Sexual Activity  Sexual Activity Not on file   Comment: condoms accepted     Objective:   Vitals:   05/04/23 0936 05/04/23 1008  BP: (!) 149/114 (!) 148/110  Pulse: 96   Resp: 16   Temp: 98 F (36.7 C)   TempSrc: Temporal   SpO2: 99%   Weight: 162 lb (73.5 kg)    Body mass index is 31.8 kg/m.  Physical Exam Vitals reviewed.  HENT:     Mouth/Throat:     Mouth: No oral lesions.     Dentition: No dental abscesses.  Cardiovascular:     Rate and Rhythm: Normal rate and regular rhythm.     Heart sounds: Normal heart  sounds.  Pulmonary:     Effort: Pulmonary effort is normal.     Breath sounds: Normal breath sounds.  Abdominal:     General: There is no distension.     Palpations: Abdomen is soft.     Tenderness: There is no abdominal tenderness.  Musculoskeletal:        General: No tenderness. Normal range of motion.  Lymphadenopathy:     Cervical: No cervical adenopathy.  Skin:    General: Skin is warm and dry.     Findings: No rash.  Neurological:     Mental Status: She is alert and oriented to person, place, and time.  Psychiatric:        Judgment: Judgment normal.     Lab Results Lab Results  Component Value Date   WBC 7.7 02/26/2022   HGB 15.4 02/26/2022   HCT 46.9 (H) 02/26/2022   MCV 90 02/26/2022   PLT 302 02/26/2022    Lab Results  Component Value Date   CREATININE 1.07 (H) 04/15/2023   BUN 8 04/15/2023   NA 140 04/15/2023   K 4.5 04/15/2023   CL 103 04/15/2023   CO2 25 04/15/2023     Lab Results  Component Value Date   ALT 31 04/15/2023   AST 45 (H) 04/15/2023   ALKPHOS 72 04/15/2023   BILITOT 0.3 04/15/2023    Lab Results  Component Value Date   CHOL 144 04/15/2023   HDL 52 04/15/2023   LDLCALC 79 04/15/2023   TRIG 64 04/15/2023   CHOLHDL 2.8 04/15/2023   HIV 1 RNA Quant  Date Value  04/09/2022 Not Detected Copies/mL  02/26/2022 31 copies/mL (H)     Assessment & Plan:   Problem List Items Addressed This Visit       Unprioritized   HIV (human immunodeficiency virus infection) (HCC)    Very well controlled on once daily Biktarvy. No concerns with access or adherence to medication. They are tolerating the medication well without side effects. No drug interactions identified. Pertinent lab tests ordered today. No changes to insurance coverage - refills sent to [facility].  No dental needs today.  No concern over anxious/depressed mood.  Sexual health and family planning discussed - routine urine cytology collected.  Vaccines updated today - see health  maintenance section.    Next appointment with me will be 10/29/2023 at 11:00 am communicated in person and written today.        Relevant Medications   bictegravir-emtricitabine-tenofovir AF (BIKTARVY) 50-200-25 MG TABS tablet   Other Relevant Orders   HIV 1 RNA quant-no reflex-bld  T-helper cells (CD4) count   CBC   Healthcare maintenance    Declined preventative vaccines.  Pap smear she reports to have been normal when she arrived in [place] 2022. Recommend repeating thi sin 2025. Updated her HM item - she also has new GYN follow up coming up in July.       Primary hypertension    She reports she is taking her diovan started by primary care team recently. She has FU scheduled with them for further dose adjustments. She is out of range today.       Other Visit Diagnoses     Routine screening for STI (sexually transmitted infection)    -  Primary   Relevant Orders   Urine cytology ancillary only      Total Encounter Time: 30 min including time spent arranging interpretation    Rexene Alberts, MSN, NP-C [facility] Pager: [phone] Office: [phone]  05/04/23  10:51 AM

## 2023-05-04 NOTE — Assessment & Plan Note (Signed)
Declined preventative vaccines.  Pap smear she reports to have been normal when she arrived in [place] 2022. Recommend repeating thi sin 2025. Updated her HM item - she also has new GYN follow up coming up in July.

## 2023-05-04 NOTE — Patient Instructions (Signed)
Will see you at your next appointment Thursday 10/29/2023 at 11:00 am

## 2023-05-04 NOTE — Assessment & Plan Note (Signed)
Very well controlled on once daily Biktarvy. No concerns with access or adherence to medication. They are tolerating the medication well without side effects. No drug interactions identified. Pertinent lab tests ordered today. No changes to insurance coverage - refills sent to [facility].  No dental needs today.  No concern over anxious/depressed mood.  Sexual health and family planning discussed - routine urine cytology collected.  Vaccines updated today - see health maintenance section.    Next appointment with me will be 10/29/2023 at 11:00 am communicated in person and written today.

## 2023-05-04 NOTE — Assessment & Plan Note (Signed)
She reports she is taking her diovan started by primary care team recently. She has FU scheduled with them for further dose adjustments. She is out of range today.

## 2023-05-05 LAB — URINE CYTOLOGY ANCILLARY ONLY
Chlamydia: NEGATIVE
Comment: NEGATIVE
Comment: NEGATIVE
Comment: NORMAL
Neisseria Gonorrhea: NEGATIVE
Trichomonas: NEGATIVE

## 2023-05-06 ENCOUNTER — Other Ambulatory Visit (HOSPITAL_COMMUNITY): Payer: Self-pay

## 2023-05-06 LAB — CBC
HCT: 40.8 % (ref 35.0–45.0)
Hemoglobin: 13.6 g/dL (ref 11.7–15.5)
MCH: 28 pg (ref 27.0–33.0)
MCHC: 33.3 g/dL (ref 32.0–36.0)
MCV: 84.1 fL (ref 80.0–100.0)
MPV: 9.8 fL (ref 7.5–12.5)
RDW: 14.8 % (ref 11.0–15.0)

## 2023-05-06 LAB — HIV-1 RNA QUANT-NO REFLEX-BLD
HIV 1 RNA Quant: <20 Copies/mL — ABNORMAL HIGH
HIV-1 RNA Quant, Log: <1.3 Log cps/mL — ABNORMAL HIGH

## 2023-05-06 LAB — T-HELPER CELLS (CD4) COUNT (NOT AT ARMC)
CD4 % Helper T Cell: 56 % (ref 33–65)
CD4 T Cell Abs: 1187 /uL (ref 400–1790)

## 2023-05-07 ENCOUNTER — Telehealth: Payer: Self-pay

## 2023-05-07 NOTE — Telephone Encounter (Signed)
-----   Message from Blanchard Kelch, NP sent at 05/07/2023  1:36 PM EDT ----- Please call Bibiana with Swahili interpretor to let her know that her viral load is undetectable - her biktarvy is working perfectly to keep her healthy.  Her Immune system looks very strong as well with her CD4 count 1187.   No changes to her plan - looking forward to seeing her at her next appt on 10/29/2023

## 2023-05-07 NOTE — Progress Notes (Signed)
Please call Caitlin Marks with Swahili interpretor to let her know that her viral load is undetectable - her biktarvy is working perfectly to keep her healthy.  Her Immune system looks very strong as well with her CD4 count 1187.   No changes to her plan - looking forward to seeing her at her next appt on 10/29/2023

## 2023-05-07 NOTE — Telephone Encounter (Signed)
Attempted to contact patient with swahili interpreter - patient didn't have good cellular service. Will try again later.    [name] Lesli Albee, CMA

## 2023-05-08 NOTE — Telephone Encounter (Signed)
Called patient with [employer], interpreter tried twice, but received greeting that caller was unavailable.   Sandie Ano, RN

## 2023-05-10 IMAGING — CR DG CHEST 2V
2 series · 2 of 2 positions shown · non-contrast
Comparison: None.

CLINICAL DATA: 31-year-old female with a history of abnormal
overseas chest xray

EXAM:
CHEST - 2 VIEW

[w chest pa]
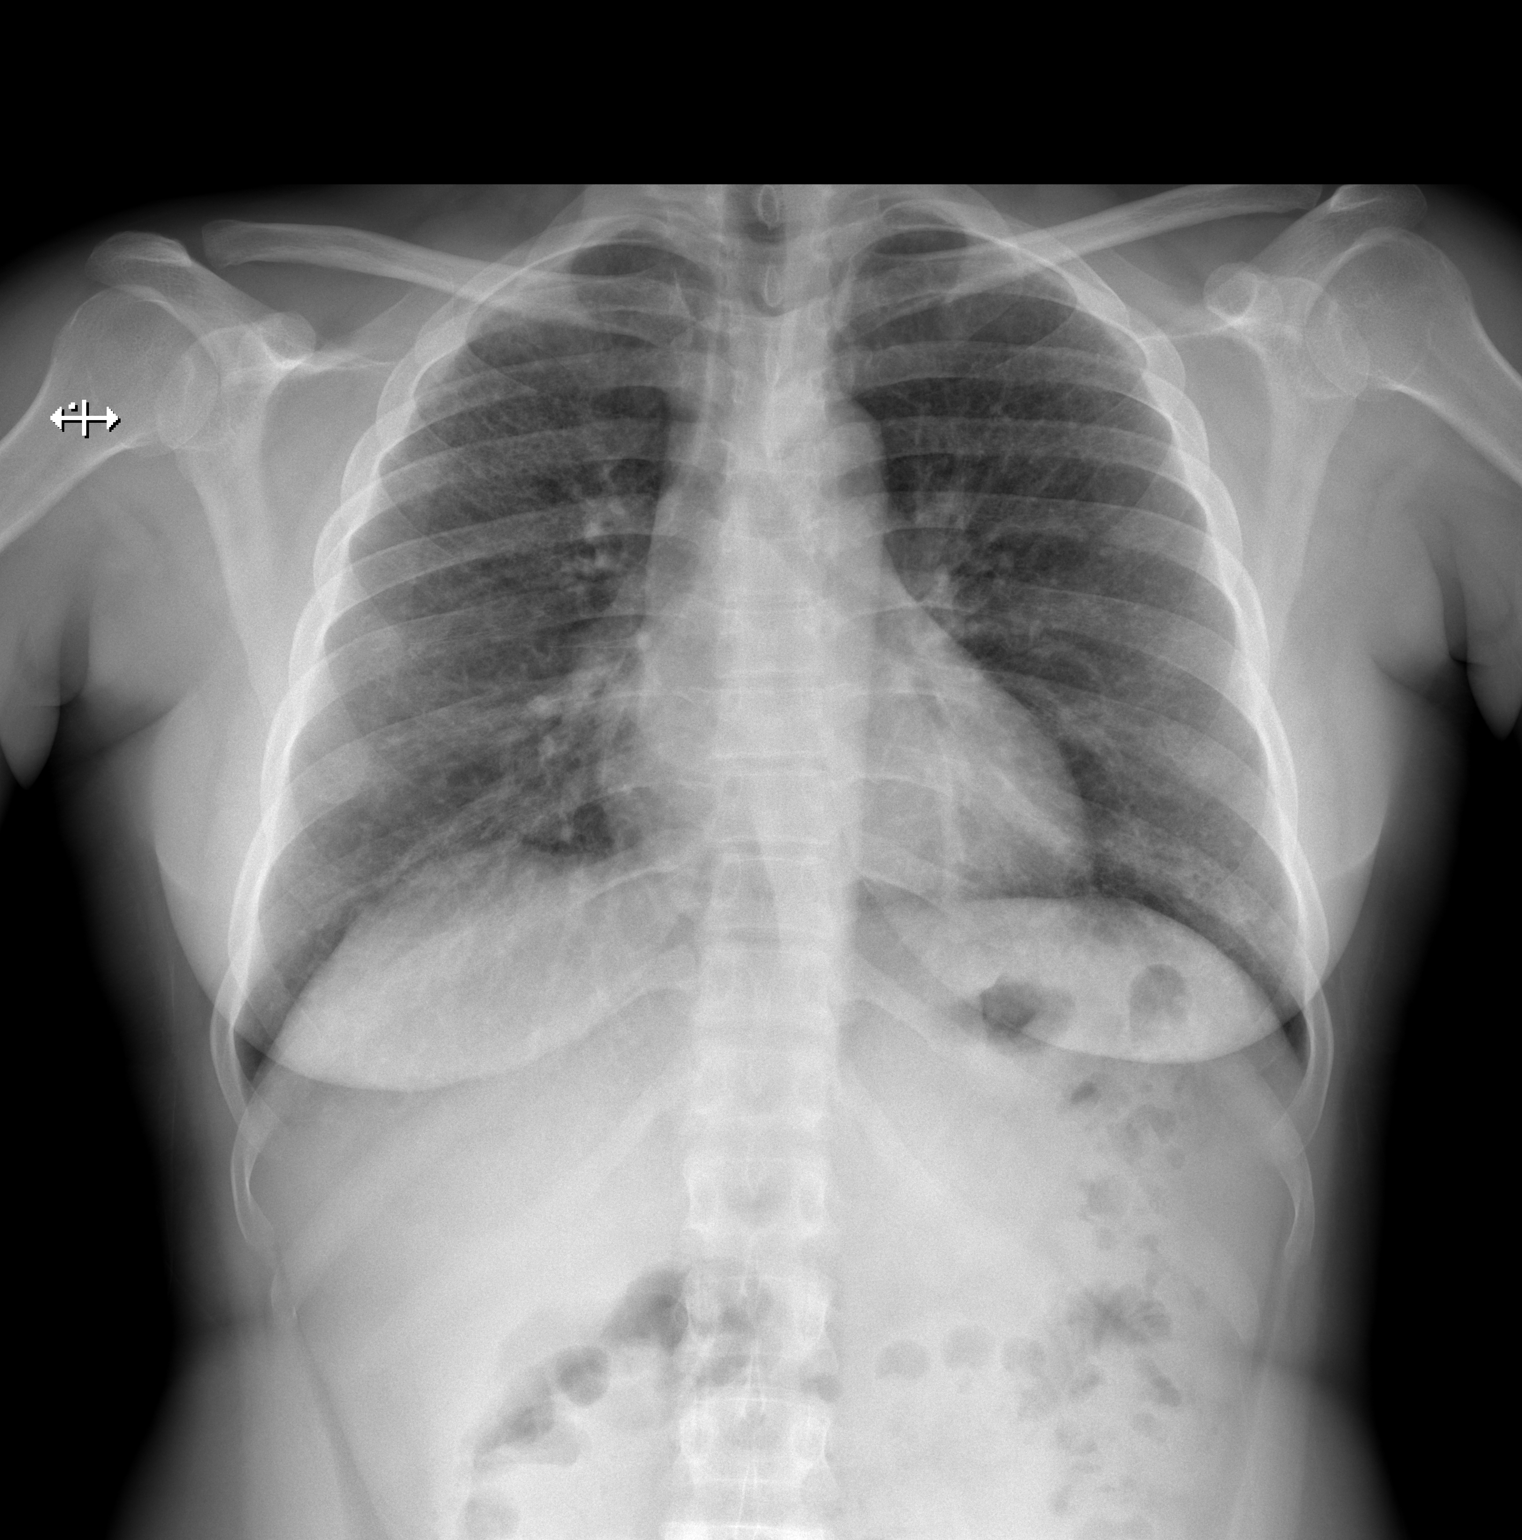

[w chest lat]
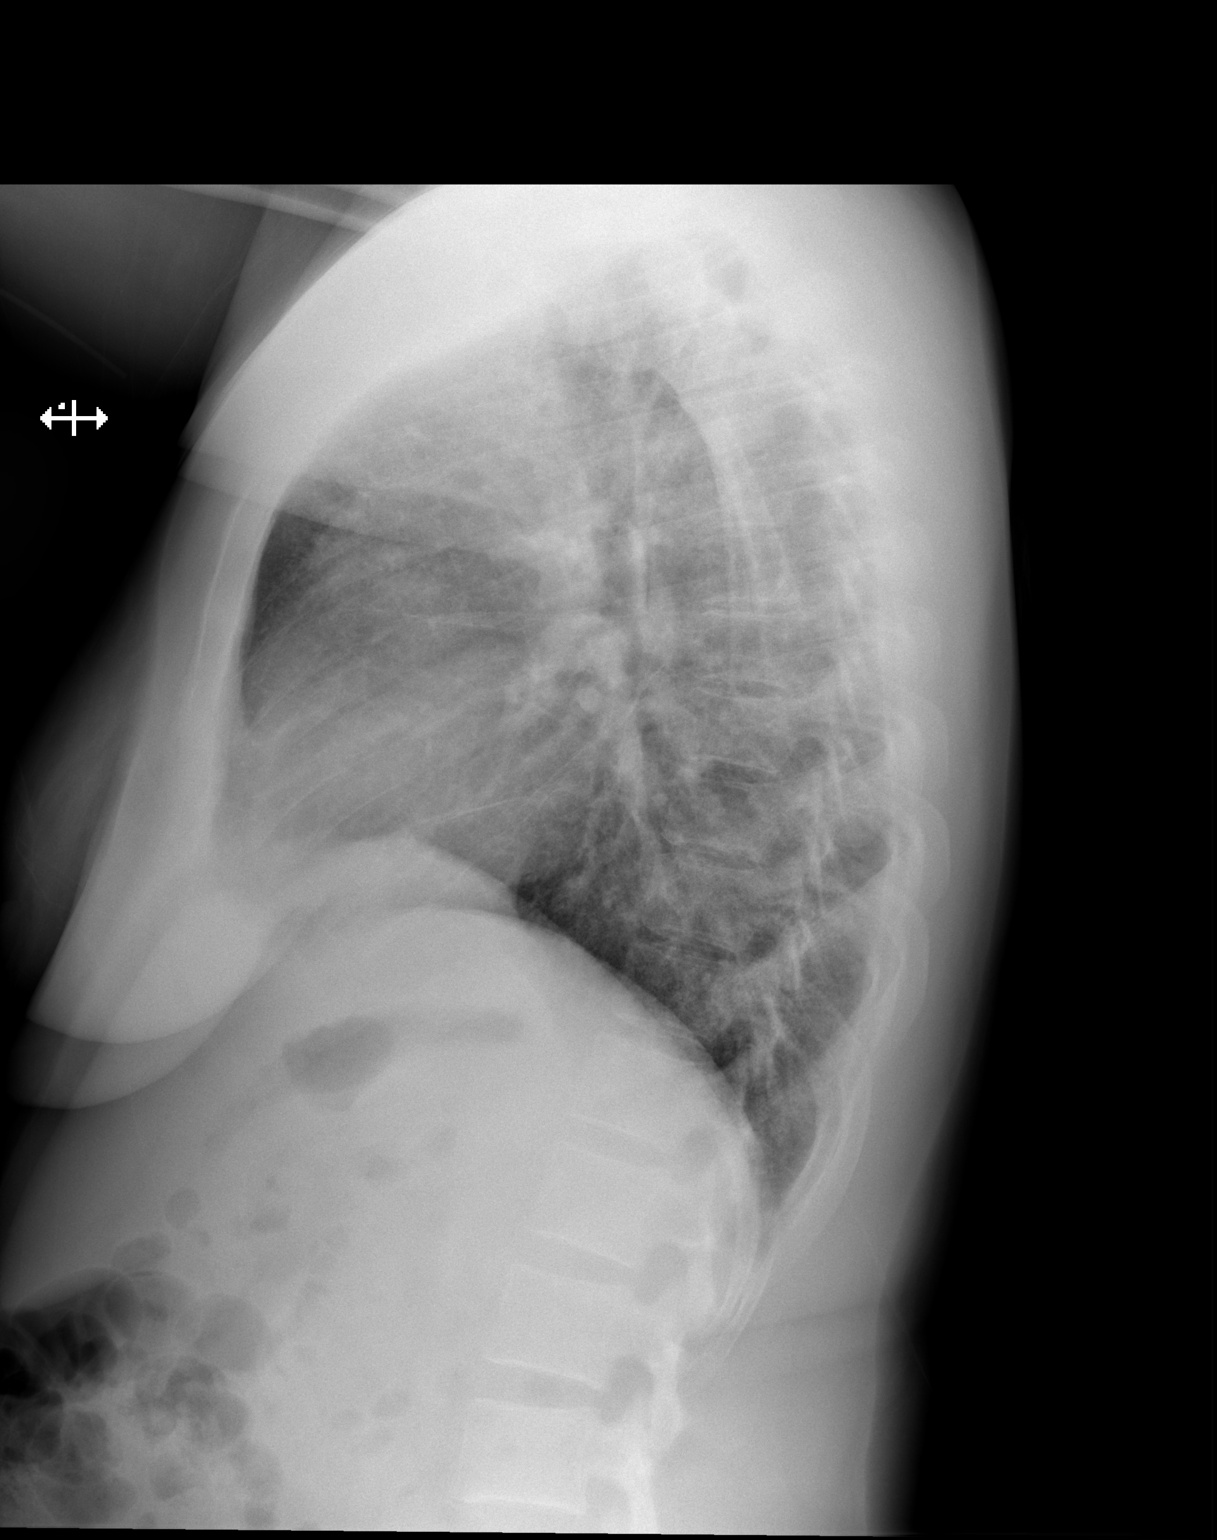

[2 of 2 positions shown; findings below may reference images not displayed]

FINDINGS: Cardiomediastinal silhouette within normal limits.

There is a pattern of microcystic change throughout the lungs. No
confluent airspace disease. No pneumothorax or pleural effusion.

No displaced fracture.
IMPRESSION: Pattern of microcystic change of the lungs, with the leading
differential diagnosis lymphangiomyomatosis. Further evaluation with
high-resolution chest CT is indicated.

## 2023-06-02 ENCOUNTER — Other Ambulatory Visit (HOSPITAL_COMMUNITY): Payer: Self-pay

## 2023-06-04 ENCOUNTER — Encounter: Payer: Self-pay | Admitting: Obstetrics and Gynecology

## 2023-06-04 ENCOUNTER — Ambulatory Visit: Payer: Medicaid Other | Admitting: Obstetrics and Gynecology

## 2023-06-04 VITALS — BP 168/124 | Temp 99.0°F | Ht 60.0 in | Wt 156.9 lb

## 2023-06-04 DIAGNOSIS — Z3046 Encounter for surveillance of implantable subdermal contraceptive: Secondary | ICD-10-CM

## 2023-06-04 NOTE — Progress Notes (Signed)
Pt presents for Nexplanon removal. Nexplanon placed 07/05/2020.  Desires pregnancy in the next year.  Last pap: Pt reports 2023, normal STD screen: Declines  Pt appears ill, states she doesn't feel well and feels cold. Oral temp 99.0

## 2023-06-04 NOTE — Progress Notes (Signed)
32 yo P5 with LMP 06/02/23 and BMI 30 presenting for Nexplanon removal. Patient is due for removal in August. Patient desires to conceive. She reports feeling well but has rhinorrhea and reports emesis this am. She denies any other sick contacts.  Past Medical History:  Diagnosis Date   HIV (human immunodeficiency virus infection) (HCC)    HIV (human immunodeficiency virus infection) (HCC) 02/26/2022   Past Surgical History:  Procedure Laterality Date   NO PAST SURGERIES     Family History  Problem Relation Age of Onset   Other Mother    Diabetes Neg Hx    Cancer Neg Hx    Social History   Tobacco Use   Smoking status: Former    Types: Cigarettes    Passive exposure: Past   Smokeless tobacco: Never  Vaping Use   Vaping status: Never Used  Substance Use Topics   Alcohol use: Yes    Comment: occasionally   Drug use: Never   ROS See pertinent in HPI. All other systems reviewed and non contributory Blood pressure (!) 168/124, temperature 99 F (37.2 C), height 5' (1.524 m), weight 156 lb 14.4 oz (71.2 kg), last menstrual period 06/02/2023. GENERAL: Well-developed, well-nourished female in no acute distress.  NEURO: alert and oriented x 3  A/P 32 yo here for Nexplanon removal Nexplanon Removal Patient given informed consent for removal of her Nexplanon, time out was performed.  Signed copy in the chart.  Appropriate time out taken. Nexplanon site identified.  Area prepped in usual sterile fashon. One cc of 1% lidocaine was used to anesthetize the area at the distal end of the implant. A small stab incision was made right beside the implant on the distal portion.  The Nexplanon rod was grasped using hemostats and removed without difficulty.  There was less than 3 cc blood loss. There were no complications.  A small amount of antibiotic ointment and steri-strips were applied over the small incision.  A pressure bandage was applied to reduce any bruising.  The patient tolerated the  procedure well and was given post procedure instructions.  Patient advised to start prenatal vitamins Patient also advised to follow up with ID to assess viral load and medications prior to conception Patient with normal pap smear in 2023 Patient noted to have elevated BP without a history of HTN. Patient advised to present to urgent care for acute management Interpreter present during the encounter

## 2023-06-23 ENCOUNTER — Other Ambulatory Visit (HOSPITAL_COMMUNITY): Payer: Self-pay

## 2023-06-24 ENCOUNTER — Other Ambulatory Visit (HOSPITAL_COMMUNITY): Payer: Self-pay

## 2023-06-26 ENCOUNTER — Other Ambulatory Visit (HOSPITAL_COMMUNITY): Payer: Self-pay

## 2023-07-20 ENCOUNTER — Other Ambulatory Visit (HOSPITAL_COMMUNITY): Payer: Self-pay

## 2023-07-22 ENCOUNTER — Other Ambulatory Visit (HOSPITAL_COMMUNITY): Payer: Self-pay

## 2023-08-06 ENCOUNTER — Other Ambulatory Visit (HOSPITAL_COMMUNITY): Payer: Self-pay

## 2023-08-25 ENCOUNTER — Other Ambulatory Visit (HOSPITAL_COMMUNITY): Payer: Self-pay

## 2023-08-25 ENCOUNTER — Other Ambulatory Visit (HOSPITAL_COMMUNITY): Payer: Self-pay | Admitting: Pharmacy Technician

## 2023-08-25 NOTE — Progress Notes (Signed)
Specialty Pharmacy Refill Coordination Note  Caitlin Marks is a 32 y.o. female contacted today regarding refills of specialty medication(s) Bictegravir-Emtricitab-Tenofov   Patient requested Delivery   Delivery date: 08/28/23   Verified address: [address] [name]   Medication will be filled on 08/27/23.

## 2023-08-27 ENCOUNTER — Other Ambulatory Visit: Payer: Self-pay

## 2023-08-31 ENCOUNTER — Other Ambulatory Visit: Payer: Self-pay

## 2023-09-14 ENCOUNTER — Other Ambulatory Visit: Payer: Self-pay

## 2023-09-14 ENCOUNTER — Other Ambulatory Visit (HOSPITAL_COMMUNITY): Payer: Self-pay

## 2023-09-14 NOTE — Progress Notes (Signed)
Specialty Pharmacy Refill Coordination Note  Caitlin Marks is a 32 y.o. female contacted with Swahili interpreter today regarding refills of specialty medication(s) Bictegravir-Emtricitab-Tenofov   Patient requested Delivery   Delivery date: 09/22/23   Verified address: [address]   Medication will be filled on 09/21/23.

## 2023-09-14 NOTE — Progress Notes (Signed)
Specialty Pharmacy Ongoing Clinical Assessment Note  Caitlin Marks is a 32 y.o. female who is being followed by the specialty pharmacy service for RxSp HIV   Patient's specialty medication(s) reviewed today: Bictegravir-Emtricitab-Tenofov   Missed doses in the last 4 weeks: 0   Patient/Caregiver did not have any additional questions or concerns.   Therapeutic benefit summary: Patient is achieving benefit   Adverse events/side effects summary: No adverse events/side effects   Patient's therapy is appropriate to: Continue    Goals Addressed             This Visit's Progress    Achieve Undetectable HIV Viral Load < 20       Patient is initiating therapy. Patient will maintain adherence.          Follow up:  6 months  Bobette Mo Specialty Pharmacist

## 2023-09-15 ENCOUNTER — Other Ambulatory Visit (HOSPITAL_COMMUNITY): Payer: Self-pay

## 2023-09-15 ENCOUNTER — Other Ambulatory Visit: Payer: Self-pay

## 2023-09-15 NOTE — Progress Notes (Signed)
Specialty Pharmacy Refill Coordination Note  Caitlin Marks is a 32 y.o. female contacted today regarding refills of specialty medication(s) Bictegravir-Emtricitab-Tenofov   Patient requested Delivery   Delivery date: 09/22/23   Verified address: [address]   Medication will be filled on 09/21/23.

## 2023-09-17 ENCOUNTER — Other Ambulatory Visit (HOSPITAL_COMMUNITY): Payer: Self-pay

## 2023-09-23 ENCOUNTER — Other Ambulatory Visit (HOSPITAL_COMMUNITY): Payer: Self-pay

## 2023-10-07 ENCOUNTER — Other Ambulatory Visit (HOSPITAL_COMMUNITY): Payer: Self-pay | Admitting: Pharmacy Technician

## 2023-10-07 ENCOUNTER — Other Ambulatory Visit (HOSPITAL_COMMUNITY): Payer: Self-pay

## 2023-10-07 NOTE — Progress Notes (Signed)
Specialty Pharmacy Refill Coordination Note  Caitlin Marks is a 32 y.o. female contacted today regarding refills of specialty medication(s) Bictegravir-Emtricitab-Tenofov   Patient requested Delivery   Delivery date: 10/16/23   Verified address: [address] [name]   Medication will be filled on 10/15/23.

## 2023-10-29 ENCOUNTER — Other Ambulatory Visit (HOSPITAL_COMMUNITY): Payer: Self-pay

## 2023-10-29 ENCOUNTER — Other Ambulatory Visit (HOSPITAL_BASED_OUTPATIENT_CLINIC_OR_DEPARTMENT_OTHER): Payer: Self-pay

## 2023-10-29 ENCOUNTER — Other Ambulatory Visit: Payer: Self-pay

## 2023-10-29 ENCOUNTER — Encounter: Payer: Self-pay | Admitting: Infectious Diseases

## 2023-10-29 ENCOUNTER — Ambulatory Visit (INDEPENDENT_AMBULATORY_CARE_PROVIDER_SITE_OTHER): Payer: Medicaid Other | Admitting: Infectious Diseases

## 2023-10-29 DIAGNOSIS — Z21 Asymptomatic human immunodeficiency virus [HIV] infection status: Secondary | ICD-10-CM

## 2023-10-29 DIAGNOSIS — I10 Essential (primary) hypertension: Secondary | ICD-10-CM | POA: Diagnosis not present

## 2023-10-29 MED ORDER — VALSARTAN 40 MG PO TABS
40.0000 mg | ORAL_TABLET | Freq: Every day | ORAL | 3 refills | Status: DC
Start: 2023-10-29 — End: 2024-02-22
  Filled 2023-10-29 (×2): qty 30, 30d supply, fill #0

## 2023-10-29 NOTE — Progress Notes (Signed)
Name: Caitlin Marks  DOB: 11/07/1991 MRN: [id number] PCP: Rema Fendt, NP     Brief Narrative:  Caitlin Marks is a 32 y.o. female with HIV. Dx 2018. 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 Discussed the use of AI scribe software for clinical note transcription with the patient, who gave verbal consent to proceed.  History of Present Illness   Caitlin Marks, with a history of hypertension and HIV, had stopped taking her valsartan due to a lack of refills after her prescription ran out from pharmacy. She reported no specific concerns or symptoms on the day of the consultation. Her blood pressure is quite elevated today. The patient was not experiencing any side effects from the Valsartan when she was taking it and open to going back on it.   The patient also has a history of HIV, which is being managed with Biktarvy. She reported that her viral loads were undetectable at her last check, indicating effective management of her HIV. The patient consented to a recheck of her viral load during this visit. In addition, the patient expressed a desire to have another child in the future hopefully this year.   The patient reported no issues with sleep and declined a flu shot and any other vaccines. She also mentioned that she is currently not working.          10/29/2023   11:09 AM  Depression screen PHQ 2/9  Decreased Interest 0  Down, Depressed, Hopeless 0  PHQ - 2 Score 0    Review of Systems  Constitutional:  Negative for  chills and fever.  HENT:  Negative for tinnitus.   Eyes:  Negative for blurred vision and photophobia.  Respiratory:  Negative for cough and sputum production.   Cardiovascular:  Negative for chest pain.  Gastrointestinal:  Negative for diarrhea, nausea and vomiting.  Genitourinary:  Negative for dysuria.  Skin:  Negative for rash.  Neurological:  Negative for headaches.     Past Medical History:  Diagnosis Date   HIV (human immunodeficiency virus infection) (HCC)    HIV (human immunodeficiency virus infection) (HCC) 02/26/2022    Outpatient Medications Prior to Visit  Medication Sig Dispense Refill   bictegravir-emtricitabine-tenofovir AF (BIKTARVY) 50-200-25 MG TABS tablet Take 1 tablet by mouth daily. Try to take at the same time each day with or without food. 30 tablet 11   methocarbamol (ROBAXIN) 500 MG tablet Take 1 tablet (500 mg total) by mouth at bedtime as needed for muscle spasms. (Patient not taking: Reported on 10/29/2023) 20 tablet 0   traMADol (ULTRAM) 50 MG tablet Take 1 tablet (50 mg total) by mouth every 6 (six) hours as needed. (Patient not taking: Reported on 10/29/2023) 15 tablet 0   valsartan (DIOVAN) 40 MG tablet Take 1 tablet (40 mg) by mouth daily. (Patient not taking: Reported on 10/29/2023) 30 tablet 1   No facility-administered medications prior to visit.     No Known Allergies  Social History   Tobacco Use  Smoking status: Some Days    Types: Cigarettes    Passive exposure: Past   Smokeless tobacco: Never  Vaping Use   Vaping status: Never Used  Substance Use Topics   Alcohol use: Not Currently    Comment: occasionally   Drug use: Never    Family History  Problem Relation Age of Onset   Other Mother    Diabetes Neg Hx    Cancer Neg Hx     Social History   Substance and Sexual Activity  Sexual Activity Not on file     Objective:   Vitals:   10/29/23 1102 10/29/23 1141  BP: (!) 160/117 (!) 162/117  Pulse: 93   Temp: 97.6 F  (36.4 C)   TempSrc: Temporal   SpO2: 98%   Weight: 161 lb (73 kg)    Body mass index is 31.44 kg/m.  Physical Exam Vitals reviewed.  HENT:     Mouth/Throat:     Mouth: No oral lesions.     Dentition: No dental abscesses.  Cardiovascular:     Rate and Rhythm: Normal rate and regular rhythm.     Heart sounds: Normal heart sounds.  Pulmonary:     Effort: Pulmonary effort is normal.     Breath sounds: Normal breath sounds.  Abdominal:     General: There is no distension.     Palpations: Abdomen is soft.     Tenderness: There is no abdominal tenderness.  Musculoskeletal:        General: No tenderness. Normal range of motion.  Lymphadenopathy:     Cervical: No cervical adenopathy.  Skin:    General: Skin is warm and dry.     Findings: No rash.  Neurological:     Mental Status: She is alert and oriented to person, place, and time.  Psychiatric:        Judgment: Judgment normal.     Lab Results Lab Results  Component Value Date   WBC 5.0 05/04/2023   HGB 13.6 05/04/2023   HCT 40.8 05/04/2023   MCV 84.1 05/04/2023   PLT 230 05/04/2023    Lab Results  Component Value Date   CREATININE 1.07 (H) 04/15/2023   BUN 8 04/15/2023   NA 140 04/15/2023   K 4.5 04/15/2023   CL 103 04/15/2023   CO2 25 04/15/2023     Lab Results  Component Value Date   ALT 31 04/15/2023   AST 45 (H) 04/15/2023   ALKPHOS 72 04/15/2023   BILITOT 0.3 04/15/2023    Lab Results  Component Value Date   CHOL 144 04/15/2023   HDL 52 04/15/2023   LDLCALC 79 04/15/2023   TRIG 64 04/15/2023   CHOLHDL 2.8 04/15/2023   HIV 1 RNA Quant  Date Value  05/04/2023 <20 Copies/mL (H)  04/09/2022 Not Detected Copies/mL  02/26/2022 31 copies/mL (H)   CD4 T Cell Abs (/uL)  Date Value  05/04/2023 1,187     Assessment & Plan:     HIV -  Patient on Biktarvy with previously undetectable viral loads and strong immune system with CD4 > 1000. Patient is considering future pregnancy and Biktarvy is  safe for use during pregnancy. We discussed this today and recommendation to start prenatal vitamin (separated from biktarvy > 6 hours) prior to conception.  -Continue Biktarvy as prescribed. -Check viral load today. -Pap smear needs to be done - she deferred today. No prior history on file.  -Deferred vaccines  -FU in 52m    Hypertension -  Elevated blood pressure noted today. Patient had stopped taking Valsartan due to lack of refills. No reported side effects from Valsartan. -Rewrite prescription for and ensure it is added to the patient's pharmacy delivery. -We scheduled a new P[facility] app[facility] with [facility] to [facility] care. Being in same building will be helpful for her.   General Health Maintenance -Declined influenza vaccine. -We helped to schedule appointment with primary care clinic for general wellness care and HTN management.      Meds ordered this encounter  Medications   valsartan (DIOVAN) 40 MG tablet    Sig: Take 1 tablet (40 mg) by mouth daily.    Dispense:  30 tablet    Refill:  3    Please mail with biktarvy prescription .   Orders Placed This Encounter  Procedures   HIV 1 RNA quant-no reflex-bld   COMPLETE METABOLIC PANEL WITH GFR   Return in about 6 months (around 04/28/2024).   Rexene Alberts, MSN, NP-C [facility] Pager: [phone] Office: [phone]  10/29/23  1:03 PM

## 2023-10-29 NOTE — Patient Instructions (Addendum)
Please restart your Losartan once a day - this is for your blood pressure   We got you scheduled for a new patient visit at the [facility] - they will need to help you with your blood pressure and other general wellness care Address: [address] [place], [place] [address] Phone: [phone]  I have scheduled you with Dr. Laural Benes Monday April 14th - be there at 8:30 am - they have put you on a wait list to call if an earlier appointment comes up sooner.

## 2023-11-01 LAB — COMPLETE METABOLIC PANEL WITH GFR
AG Ratio: 1.3 (calc) (ref 1.0–2.5)
ALT: 18 U/L (ref 6–29)
AST: 24 U/L (ref 10–30)
Albumin: 4.3 g/dL (ref 3.6–5.1)
Alkaline phosphatase (APISO): 71 U/L (ref 31–125)
BUN: 7 mg/dL (ref 7–25)
CO2: 22 mmol/L (ref 20–32)
Calcium: 8.9 mg/dL (ref 8.6–10.2)
Chloride: 103 mmol/L (ref 98–110)
Creat: 0.87 mg/dL (ref 0.50–0.97)
Globulin: 3.4 g/dL (ref 1.9–3.7)
Glucose, Bld: 76 mg/dL (ref 65–99)
Potassium: 4 mmol/L (ref 3.5–5.3)
Sodium: 137 mmol/L (ref 135–146)
Total Bilirubin: 0.6 mg/dL (ref 0.2–1.2)
Total Protein: 7.7 g/dL (ref 6.1–8.1)
eGFR: 92 mL/min/{1.73_m2} (ref >=60)

## 2023-11-01 LAB — HIV-1 RNA QUANT-NO REFLEX-BLD
HIV 1 RNA Quant: <20 {copies}/mL — ABNORMAL HIGH
HIV-1 RNA Quant, Log: <1.3 {Log} — ABNORMAL HIGH

## 2023-11-02 ENCOUNTER — Other Ambulatory Visit (HOSPITAL_COMMUNITY): Payer: Self-pay

## 2023-11-05 ENCOUNTER — Other Ambulatory Visit: Payer: Self-pay

## 2023-11-09 ENCOUNTER — Telehealth: Payer: Self-pay

## 2023-11-09 NOTE — Telephone Encounter (Signed)
-----   Message from [place] sent at 11/09/2023  3:07 PM EST ----- Please call Caitlin Marks to let her know that her viral load remains undetectable.  Liver and kidney function are all normal.  Can we verify she picked up and has started taking her blood pressure medication please?   TY!

## 2023-11-09 NOTE — Telephone Encounter (Signed)
Called patient with swahili interpreter - patient aware of results and stated she is taking her blood pressure medications.   Crawford Antolin Lesli Albee, CMA

## 2023-11-11 NOTE — L&D Delivery Note (Signed)
 OB/GYN Faculty Practice Delivery Note  Caitlin Marks is a 33 y.o. [id number] s/p SVD at [redacted]w[redacted]d. She was admitted for PTL.   ROM: 0h 53m with particulate meconium fluid GBS Status:   Unknown, treated with PCN Maximum Maternal Temperature: 97.5  Labor Progress: Initial SVE: 6/90/0. No augmentation. She then progressed to complete.   Delivery Date/Time: 10/31/2024 8:31 AM Delivery: Called to room for decel. She was found to be complete. Head delivered straight occiput anterior. nuchal cord present x1. Shoulder and body delivered in usual fashion with terminal mec and maternal urination onto the infant. Infant placed on mother's abdomen, dried and stimulated. Cord clamped x2 and cut after 30sec delay, then spontaneous cry. Cord blood collected and venous cord gas collected. Placenta delivered spontaneously with gentle cord traction. Fundus firm with massage and Pitocin . Labia, perineum, vagina, and cervix were inspected and repaired.  Placenta:  spontaneous, Intact with meconium. Placenta to pathology Complications:variable decelerations, late decelerations Lacerations: 1st degree repaired and labial not repaired QBL: Analgesia: Epidural  Newborn Data:  Living status:Living Gender:Female Apgars:4 ,8  Weight:1860 g           Barabara Maier, DO FM-OB Fellow [facility] [employer]

## 2023-11-12 NOTE — Congregational Nurse Program (Signed)
  Dept: [phone]   Congregational Nurse Program Note  Date of Encounter: 11/12/2023  Past Medical History: Past Medical History:  Diagnosis Date   HIV (human immunodeficiency virus infection) (HCC)    HIV (human immunodeficiency virus infection) (HCC) 02/26/2022    Encounter Details:  Community Questionnaire - 11/12/23 1547       Questionnaire   Ask client: Do you give verbal consent for me to treat you today? Yes    Student Assistance N/A    Location Patient Served  [facility]    Encounter Setting Phone/Text/Email    Population Status Migrant/Refugee    Insurance Medicaid    Insurance/Financial Assistance Referral Charitable Care    Medication Have Medication Insecurities    Medical Provider Yes    Screening Referrals Made N/A    Medical Referrals Made [facility]    Medical Appointment Completed [facility] P[facility]    CNP Interventions Advocate/Support;Navigate Healthcare System;Case Management;Counsel;Educate    Screenings CN Performed N/A    ED Visit Averted Yes    Life-Saving Intervention Made N/A           Patient requesting assistance  with warm winter clothes. Appointment scheduled with back [facility] for January 20th.   Naomie Voula Waln RN BSN PCCN  [facility] [phone]-cell [phone]-office

## 2023-11-26 ENCOUNTER — Other Ambulatory Visit (HOSPITAL_COMMUNITY): Payer: Self-pay

## 2023-11-26 ENCOUNTER — Other Ambulatory Visit: Payer: Self-pay

## 2023-11-26 NOTE — Progress Notes (Signed)
Specialty Pharmacy Refill Coordination Note  Caitlin Marks is a 33 y.o. female contacted today regarding refills of specialty medication(s) Bictegravir-Emtricitab-Tenofov Musician)   Patient requested Delivery   Delivery date: 11/30/23   Verified address: [address] [name] [address]   Medication will be filled on 11/27/23.

## 2023-11-27 ENCOUNTER — Other Ambulatory Visit: Payer: Self-pay

## 2023-12-14 ENCOUNTER — Other Ambulatory Visit: Payer: Self-pay

## 2023-12-17 ENCOUNTER — Other Ambulatory Visit (HOSPITAL_COMMUNITY): Payer: Self-pay

## 2023-12-18 ENCOUNTER — Other Ambulatory Visit: Payer: Self-pay

## 2023-12-18 NOTE — Progress Notes (Signed)
 Specialty Pharmacy Refill Coordination Note  Caitlin Marks is a 33 y.o. female contacted today regarding refills of specialty medication(s) Bictegravir-Emtricitab-Tenofov (Biktarvy )   Patient requested Delivery   Delivery date: 12/24/23   Verified address: [address] [place] [address]   Medication will be filled on 02.12.25.

## 2023-12-19 NOTE — Congregational Nurse Program (Signed)
  Dept: [phone]   Congregational Nurse Program Note  Date of Encounter: 12/19/2023  Past Medical History: Past Medical History:  Diagnosis Date   HIV (human immunodeficiency virus infection) (HCC)    HIV (human immunodeficiency virus infection) (HCC) 02/26/2022    Encounter Details:  Community Questionnaire - 12/18/23 1200       Questionnaire   Ask client: Do you give verbal consent for me to treat you today? Yes    Student Assistance N/A    Location Patient Served  [facility]    Encounter Setting Phone/Text/Email    Population Status Migrant/Refugee    Insurance/Financial Assistance Referral Charitable Care    Medication Have Medication Insecurities    Medical Provider Yes    Screening Referrals Made N/A    Medical Referrals Made [facility]    Medical Appointment Completed [facility]    CNP Interventions Advocate/Support;Navigate Healthcare System;Case Management;Counsel;Educate    Screenings CN Performed N/A    ED Visit Averted Yes    Life-Saving Intervention Made N/A            12/17/20  Assisted with care coordination with specialty pharmacy for medication refill. Medication will delivered to patient home.  Naomie Shukri Nistler RN BSN PCCN  [facility] [phone]-cell [phone]-office

## 2024-01-07 ENCOUNTER — Other Ambulatory Visit: Payer: Self-pay

## 2024-01-08 ENCOUNTER — Other Ambulatory Visit: Payer: Self-pay

## 2024-01-08 NOTE — Progress Notes (Signed)
 Specialty Pharmacy Refill Coordination Note  Caitlin Marks is a 33 y.o. female contacted today regarding refills of specialty medication(s) Bictegravir-Emtricitab-Tenofov Musician)   Patient requested Delivery   Delivery date: 01/18/24   Verified address: [address] [name] [address]   Medication will be filled on 01/15/24.     Spoke with congregational nurse Dorothy.

## 2024-01-10 ENCOUNTER — Telehealth: Payer: Self-pay

## 2024-01-10 NOTE — Telephone Encounter (Signed)
 01/08/24- Received a call from [facility] specialty pharmacy for medication refill. Refill completed and medication will be delivered to patient residence.  Nicole Cella Ernestyne Caldwell RN BSN PCCN  [facility] [phone] [phone]-office

## 2024-01-14 ENCOUNTER — Other Ambulatory Visit: Payer: Self-pay

## 2024-01-15 ENCOUNTER — Other Ambulatory Visit: Payer: Self-pay

## 2024-02-04 ENCOUNTER — Other Ambulatory Visit: Payer: Self-pay

## 2024-02-04 NOTE — Progress Notes (Signed)
 Specialty Pharmacy Refill Coordination Note  Caitlin Marks is a 33 y.o. female contacted today regarding refills of specialty medication(s) Bictegravir-Emtricitab-Tenofov Musician)   Patient requested Delivery   Delivery date: 02/22/24   Verified address: [address] [name] [address]   Medication will be filled on 02/19/24.

## 2024-02-04 NOTE — Congregational Nurse Program (Signed)
  Dept: [phone]   Congregational Nurse Program Note  Date of Encounter: 02/04/2024  Past Medical History: Past Medical History:  Diagnosis Date   HIV (human immunodeficiency virus infection) (HCC)    HIV (human immunodeficiency virus infection) (HCC) 02/26/2022    Encounter Details:  Community Questionnaire - 02/04/24 1250       Questionnaire   Ask client: Do you give verbal consent for me to treat you today? Yes    Student Assistance N/A    Location Patient Served  [facility]    Encounter Setting Phone/Text/Email    Population Status Migrant/Refugee    Insurance Medicaid    Insurance/Financial Assistance Referral Charitable Care    Medication Have Medication Insecurities;Patient Medications Reviewed    Medical Provider Yes    Screening Referrals Made N/A    Medical Referrals Made [facility]    Medical Appointment Completed [facility]    CNP Interventions Advocate/Support;Navigate Healthcare System;Case Management;Counsel;Educate    Screenings CN Performed N/A    ED Visit Averted Yes    Life-Saving Intervention Made N/A            Received call form [facility] to coordinate medication refill in view of language barrier. Refill completed.Medications will be shipped to home.  Nicole Cella Tlaloc Taddei RN BSN PCCN  [facility] [phone]-cell [phone]-office

## 2024-02-19 ENCOUNTER — Other Ambulatory Visit: Payer: Self-pay

## 2024-02-21 ENCOUNTER — Telehealth: Payer: Self-pay

## 2024-02-21 NOTE — Telephone Encounter (Signed)
 Patient called me requesting transportation assistance.Transportation assistance provided by [facility]. [facility] pick up time 0815 am. Patient made aware.  Perla Bradford Merina Behrendt RN BSN PCCN  [facility] [phone]-cell [phone]-office

## 2024-02-22 ENCOUNTER — Ambulatory Visit: Payer: Medicaid Other | Attending: Internal Medicine | Admitting: Internal Medicine

## 2024-02-22 ENCOUNTER — Encounter: Payer: Self-pay | Admitting: Internal Medicine

## 2024-02-22 ENCOUNTER — Other Ambulatory Visit: Payer: Self-pay

## 2024-02-22 ENCOUNTER — Other Ambulatory Visit (HOSPITAL_COMMUNITY): Payer: Self-pay

## 2024-02-22 VITALS — BP 185/121 | HR 87 | Temp 98.2°F | Ht 60.0 in | Wt 167.0 lb

## 2024-02-22 DIAGNOSIS — Z21 Asymptomatic human immunodeficiency virus [HIV] infection status: Secondary | ICD-10-CM | POA: Diagnosis not present

## 2024-02-22 DIAGNOSIS — Z5320 Procedure and treatment not carried out because of patient's decision for unspecified reasons: Secondary | ICD-10-CM | POA: Diagnosis not present

## 2024-02-22 DIAGNOSIS — Z2821 Immunization not carried out because of patient refusal: Secondary | ICD-10-CM

## 2024-02-22 DIAGNOSIS — F17210 Nicotine dependence, cigarettes, uncomplicated: Secondary | ICD-10-CM | POA: Diagnosis not present

## 2024-02-22 DIAGNOSIS — I10 Essential (primary) hypertension: Secondary | ICD-10-CM | POA: Diagnosis not present

## 2024-02-22 DIAGNOSIS — Z7689 Persons encountering health services in other specified circumstances: Secondary | ICD-10-CM

## 2024-02-22 MED ORDER — VALSARTAN 40 MG PO TABS
40.0000 mg | ORAL_TABLET | Freq: Every day | ORAL | 1 refills | Status: DC
  Filled 2024-02-22: qty 90, 90d supply, fill #0

## 2024-02-22 MED ORDER — AMLODIPINE BESYLATE 5 MG PO TABS
5.0000 mg | ORAL_TABLET | Freq: Every day | ORAL | 1 refills | Status: DC
  Filled 2024-02-22: qty 90, 90d supply, fill #0

## 2024-02-22 NOTE — Progress Notes (Signed)
 Patient ID: Caitlin Marks, female    DOB: [birth year]  MRN: [id number]  CC: Establish Care (Est care / new pt. Med refill. /No questions / concerns/No to pneumonia vax)   Subjective: Caitlin Marks is a 33 y.o. female who presents for new pt visit.  Caitlin Marks, from [facility], interprets. Her concerns today include:  Patient with history of HTN, HIV, tobacco dependence.  Previous PCP was NP Delfin Gant at [facility]  Discussed the use of AI scribe software for clinical note transcription with the patient, who gave verbal consent to proceed.  History of Present Illness   Miss Caitlin Marks, previously managed by Delfin Gant, presents to establish care and manage hypertension. She reports taking two medications daily, one for hypertension (Valsartan) and one for HIV (Biktarvy). She takes her medications consistently, with Valsartan administered in the evenings. She does not have a device to check her blood pressure at home and admits to not limiting salt in her diet. She experiences occasional chest pain and difficulty breathing, but these symptoms are not consistent. She is a light smoker, smoking approximately twice a week, but not every week. She is compliant with her HIV medication and follows up regularly with the infectious disease clinic. She is due for a Pap smear but wishes to delay this procedure.       Patient Active Problem List   Diagnosis Date Noted   Primary hypertension 04/15/2023   Racing heart beat 04/09/2022   HIV (human immunodeficiency virus infection) (HCC) 02/26/2022   Healthcare maintenance 02/26/2022     Current Outpatient Medications on File Prior to Visit  Medication Sig Dispense Refill   bictegravir-emtricitabine-tenofovir AF (BIKTARVY) 50-200-25 MG TABS tablet Take 1 tablet by mouth daily. Try to take at the same time each day with or without food. 30 tablet 11   valsartan (DIOVAN) 40 MG tablet Take 1 tablet (40 mg) by mouth daily. 30 tablet 3    methocarbamol (ROBAXIN) 500 MG tablet Take 1 tablet (500 mg total) by mouth at bedtime as needed for muscle spasms. (Patient not taking: Reported on 02/22/2024) 20 tablet 0   traMADol (ULTRAM) 50 MG tablet Take 1 tablet (50 mg total) by mouth every 6 (six) hours as needed. (Patient not taking: Reported on 02/22/2024) 15 tablet 0   No current facility-administered medications on file prior to visit.    No Known Allergies  Social History   Socioeconomic History   Marital status: Single    Spouse name: Not on file   Number of children: Not on file   Years of education: Not on file   Highest education level: Not on file  Occupational History   Not on file  Tobacco Use   Smoking status: Some Days    Types: Cigarettes    Start date: 11/10/2022    Passive exposure: Past   Smokeless tobacco: Never  Vaping Use   Vaping status: Never Used  Substance and Sexual Activity   Alcohol use: Not Currently    Comment: occasionally   Drug use: Never   Sexual activity: Not on file  Other Topics Concern   Not on file  Social History Narrative   Not on file   Social Drivers of Health   Financial Resource Strain: Not on file  Food Insecurity: Not on file  Transportation Needs: Unmet Transportation Needs (02/04/2024)   PRAPARE - Administrator, Civil Service (Medical): Yes    Lack of Transportation (Non-Medical): Yes  Physical  Activity: Not on file  Stress: Not on file  Social Connections: Not on file  Intimate Partner Violence: Not on file    Family History  Problem Relation Age of Onset   Other Mother    Diabetes Neg Hx    Cancer Neg Hx     Past Surgical History:  Procedure Laterality Date   NO PAST SURGERIES      ROS: Review of Systems Negative except as stated above  PHYSICAL EXAM: BP (!) 185/121 (BP Location: Left Arm, Patient Position: Sitting, Cuff Size: Normal)   Pulse 87   Temp 98.2 F (36.8 C) (Oral)   Ht 5' (1.524 m)   Wt 167 lb (75.8 kg)   SpO2 99%    BMI 32.61 kg/m   Physical Exam  General appearance - alert, well appearing, middle age African female and in no distress Mental status - normal mood, behavior, speech, dress, motor activity, and thought processes Neck - supple, no significant adenopathy Chest - clear to auscultation, no wheezes, rales or rhonchi, symmetric air entry Heart - normal rate, regular rhythm, normal S1, S2, no murmurs, rubs, clicks or gallops Extremities - peripheral pulses normal, no pedal edema, no clubbing or cyanosis      Latest Ref Rng & Units 10/29/2023   11:42 AM 04/15/2023   11:38 AM  CMP  Glucose 65 - 99 mg/dL 76  86   BUN 7 - 25 mg/dL 7  8   Creatinine 1.61 - 0.97 mg/dL 0.96  0.45   Sodium 409 - 146 mmol/L 137  140   Potassium 3.5 - 5.3 mmol/L 4.0  4.5   Chloride 98 - 110 mmol/L 103  103   CO2 20 - 32 mmol/L 22  25   Calcium 8.6 - 10.2 mg/dL 8.9  9.2   Total Protein 6.1 - 8.1 g/dL 7.7  7.6   Total Bilirubin 0.2 - 1.2 mg/dL 0.6  0.3   Alkaline Phos 44 - 121 IU/L  72   AST 10 - 30 U/L 24  45   ALT 6 - 29 U/L 18  31    Lipid Panel     Component Value Date/Time   CHOL 144 04/15/2023 1138   TRIG 64 04/15/2023 1138   HDL 52 04/15/2023 1138   CHOLHDL 2.8 04/15/2023 1138   LDLCALC 79 04/15/2023 1138    CBC    Component Value Date/Time   WBC 5.0 05/04/2023 1018   RBC 4.85 05/04/2023 1018   HGB 13.6 05/04/2023 1018   HGB 15.4 02/26/2022 1104   HCT 40.8 05/04/2023 1018   HCT 46.9 (H) 02/26/2022 1104   PLT 230 05/04/2023 1018   PLT 302 02/26/2022 1104   MCV 84.1 05/04/2023 1018   MCV 90 02/26/2022 1104   MCH 28.0 05/04/2023 1018   MCHC 33.3 05/04/2023 1018   RDW 14.8 05/04/2023 1018   RDW 13.4 02/26/2022 1104   LYMPHSABS 2.1 02/26/2022 1104   EOSABS 0.1 02/26/2022 1104   BASOSABS 0.0 02/26/2022 1104    ASSESSMENT AND PLAN: 1. Establishing care with new doctor, encounter for (Primary)   2. Essential hypertension Not at goal.  Discussed cardiovascular risks associated with  elevated blood pressure.  DASH diet discussed and encouraged.  Continue valsartan 40 mg daily.  I recommend adding amlodipine 5 mg daily.  Follow-up in 1 month for recheck. - valsartan (DIOVAN) 40 MG tablet; Take 1 tablet (40 mg) by mouth daily.  Dispense: 90 tablet; Refill: 1 - amLODipine (NORVASC) 5  MG tablet; Take 1 tablet (5 mg total) by mouth daily.  Dispense: 90 tablet; Refill: 1  3. Light smoker Advised to quit.  Discussed health risks associated with smoking.  4. Asymptomatic HIV infection, with no history of HIV-related illness (HCC) Plugged in with infectious disease.  Continue Biktarvy as prescribed.  5. Pneumococcal vaccination declined   6. Pap smear of cervix declined Pt defers for now and does not wish to schedule for a future date.  Will address again on subsequent visit.   Patient was given the opportunity to ask questions.  Patient verbalized understanding of the plan and was able to repeat key elements of the plan.   This documentation was completed using Paediatric nurse.  Any transcriptional errors are unintentional.  No orders of the defined types were placed in this encounter.    Requested Prescriptions   Pending Prescriptions Disp Refills   valsartan (DIOVAN) 40 MG tablet 90 tablet 1    Sig: Take 1 tablet (40 mg) by mouth daily.   bictegravir-emtricitabine-tenofovir AF (BIKTARVY) 50-200-25 MG TABS tablet 90 tablet 3    Sig: Take 1 tablet by mouth daily. Try to take at the same time each day with or without food.    No follow-ups on file.  Concetta Dee, MD, FACP

## 2024-02-24 ENCOUNTER — Telehealth: Payer: Self-pay

## 2024-02-24 NOTE — Telephone Encounter (Signed)
 Patient called me with questions regarding new medication. She wanted to know  how to take medication. I asked her to send me a picture of the medication bottle. According to the picture, she is to take amlodipine one tablet daily.I confirmed this with her medical records. Instructions provided and patient verbalized understanding.  Perla Bradford Alyze Lauf RN BSN PCCN  [facility] [phone] [phone]-office

## 2024-03-07 ENCOUNTER — Other Ambulatory Visit (HOSPITAL_COMMUNITY): Payer: Self-pay

## 2024-03-08 ENCOUNTER — Other Ambulatory Visit (HOSPITAL_COMMUNITY): Payer: Self-pay

## 2024-03-09 ENCOUNTER — Other Ambulatory Visit (HOSPITAL_COMMUNITY): Payer: Self-pay

## 2024-03-10 ENCOUNTER — Other Ambulatory Visit (HOSPITAL_COMMUNITY): Payer: Self-pay

## 2024-03-10 ENCOUNTER — Other Ambulatory Visit: Payer: Self-pay

## 2024-03-10 NOTE — Progress Notes (Signed)
 Specialty Pharmacy Ongoing Clinical Assessment Note  Caitlin Marks is a 33 y.o. female who is being followed by the specialty pharmacy service for RxSp HIV   Patient's specialty medication(s) reviewed today: Bictegravir-Emtricitab-Tenofov (Biktarvy )   Missed doses in the last 4 weeks: 0   Patient/Caregiver did not have any additional questions or concerns.   Therapeutic benefit summary: Patient is achieving benefit   Adverse events/side effects summary: No adverse events/side effects   Patient's therapy is appropriate to: Continue    Goals Addressed             This Visit's Progress    Achieve Undetectable HIV Viral Load < 20       Patient is on track. Patient will maintain adherence. Viral load remains undetectable.           Follow up:  6 months  Caitlin Marks Specialty Pharmacist

## 2024-03-10 NOTE — Progress Notes (Signed)
 Specialty Pharmacy Refill Coordination Note  Caitlin Marks is a 33 y.o. female contacted today regarding refills of specialty medication(s) Bictegravir-Emtricitab-Tenofov (Biktarvy )   Patient requested Delivery   Delivery date: 03/15/24   Verified address: [address] [place] [address]   Medication will be filled on 03/14/24.

## 2024-03-14 ENCOUNTER — Other Ambulatory Visit: Payer: Self-pay

## 2024-03-14 DIAGNOSIS — Z23 Encounter for immunization: Secondary | ICD-10-CM | POA: Diagnosis not present

## 2024-03-29 ENCOUNTER — Other Ambulatory Visit: Payer: Self-pay

## 2024-04-05 ENCOUNTER — Other Ambulatory Visit: Payer: Self-pay

## 2024-04-07 ENCOUNTER — Other Ambulatory Visit: Payer: Self-pay | Admitting: Pharmacy Technician

## 2024-04-07 ENCOUNTER — Other Ambulatory Visit: Payer: Self-pay | Admitting: Infectious Diseases

## 2024-04-07 ENCOUNTER — Other Ambulatory Visit: Payer: Self-pay

## 2024-04-07 ENCOUNTER — Other Ambulatory Visit (HOSPITAL_COMMUNITY): Payer: Self-pay

## 2024-04-07 DIAGNOSIS — Z21 Asymptomatic human immunodeficiency virus [HIV] infection status: Secondary | ICD-10-CM

## 2024-04-07 MED ORDER — BIKTARVY 50-200-25 MG PO TABS
1.0000 | ORAL_TABLET | Freq: Every day | ORAL | 0 refills | Status: DC
  Filled 2024-04-07: qty 30, 30d supply, fill #0

## 2024-04-07 NOTE — Progress Notes (Signed)
 Specialty Pharmacy Refill Coordination Note  Caitlin Marks is a 33 y.o. female contacted today regarding refills of specialty medication(s) Bictegravir-Emtricitab-Tenofov (Biktarvy )  Spoke with Dorothy Muhoro (congregational nurse)  Patient requested Delivery   Delivery date: 04/13/24   Verified address: [address] [place]   Medication will be filled on 04/12/24.  This fill date is pending response to refill request from provider. Patient is aware and if they have not received fill by intended date they must follow up with pharmacy.

## 2024-04-08 ENCOUNTER — Telehealth: Payer: Self-pay

## 2024-04-08 NOTE — Telephone Encounter (Signed)
 Patient  called and informed that specialty medication will be delivered to her home around 04/13/24.   Perla Bradford Jalesha Plotz RN BSN PCCN  [facility] [phone]-cell [phone]-office

## 2024-04-14 DIAGNOSIS — Z23 Encounter for immunization: Secondary | ICD-10-CM | POA: Diagnosis not present

## 2024-04-22 NOTE — Congregational Nurse Program (Signed)
  Dept: [phone]   Congregational Nurse Program Note  Date of Encounter: 04/22/2024  Past Medical History: Past Medical History:  Diagnosis Date   HIV (human immunodeficiency virus infection) (HCC)    HIV (human immunodeficiency virus infection) (HCC) 02/26/2022    Encounter Details:  Community Questionnaire - 04/22/24 1650       Questionnaire   Ask client: Do you give verbal consent for me to treat you today? Yes    Student Assistance N/A    Location Patient Served  [facility]    Encounter Setting Phone/Text/Email    Population Status Migrant/Refugee    Insurance Medicaid    Insurance/Financial Assistance Referral Charitable Care    Medication Have Medication Insecurities;Patient Medications Reviewed    Medical Provider Yes    Screening Referrals Made N/A    Medical Referrals Made [facility]    Medical Appointment Completed [facility] PCP[facility]    CNP Interventions Advocate/Support;Navigate Healthcare System;Case Management;Counsel;Educate    Screenings CN Performed N/A    ED Visit Averted Yes    Life-Saving Intervention Made N/A         Patient requesting assistance to reschedule appointment due to work schedule conflict. Patient has started a new job and is only available on Fridays. Appointments with infectious disease and PCP rescheduled.  Perla Bradford Jacey Eckerson RN BSN PCCN  [facility] [phone]-cell [phone]-office

## 2024-04-25 ENCOUNTER — Ambulatory Visit: Admitting: Internal Medicine

## 2024-05-02 ENCOUNTER — Other Ambulatory Visit: Payer: Self-pay | Admitting: Infectious Diseases

## 2024-05-02 ENCOUNTER — Other Ambulatory Visit: Payer: Self-pay

## 2024-05-02 DIAGNOSIS — Z21 Asymptomatic human immunodeficiency virus [HIV] infection status: Secondary | ICD-10-CM

## 2024-05-02 MED ORDER — BIKTARVY 50-200-25 MG PO TABS
1.0000 | ORAL_TABLET | Freq: Every day | ORAL | 0 refills | Status: DC
  Filled 2024-05-02: qty 30, 30d supply, fill #0

## 2024-05-02 NOTE — Progress Notes (Signed)
 Specialty Pharmacy Refill Coordination Note  Caitlin Marks is a 33 y.o. female contacted today regarding refills of specialty medication(s) Bictegravir-Emtricitab-Tenofov (Biktarvy )   Patient requested Delivery   Delivery date: 05/06/24   Verified address: [address] [place]   Medication will be filled on 05/05/24.

## 2024-05-03 ENCOUNTER — Ambulatory Visit: Payer: Medicaid Other | Admitting: Infectious Diseases

## 2024-05-05 ENCOUNTER — Telehealth: Payer: Self-pay

## 2024-05-05 ENCOUNTER — Other Ambulatory Visit: Payer: Self-pay

## 2024-05-05 NOTE — Telephone Encounter (Signed)
 Patient informed via whatsapp calling that specialty medication has be refilled and will be delivered to her residence.Patient advised to expect delivery.  Naomie Deeana Atwater RN BSN PCCN  [facility] [phone]-cell [phone]-office

## 2024-05-24 ENCOUNTER — Other Ambulatory Visit: Payer: Self-pay

## 2024-05-25 ENCOUNTER — Encounter (HOSPITAL_COMMUNITY): Payer: Self-pay | Admitting: *Deleted

## 2024-05-25 ENCOUNTER — Encounter (HOSPITAL_COMMUNITY): Payer: Self-pay | Admitting: Obstetrics and Gynecology

## 2024-05-25 ENCOUNTER — Other Ambulatory Visit: Payer: Self-pay

## 2024-05-25 ENCOUNTER — Inpatient Hospital Stay (HOSPITAL_COMMUNITY)
Admission: AD | Admit: 2024-05-25 | Discharge: 2024-05-25 | Disposition: A | Discharge status: Home / Self Care | Attending: Obstetrics and Gynecology | Admitting: Obstetrics and Gynecology

## 2024-05-25 ENCOUNTER — Ambulatory Visit (HOSPITAL_COMMUNITY)
Admission: EM | Admit: 2024-05-25 | Discharge: 2024-05-25 | Disposition: A | Discharge status: Home / Self Care | Attending: Internal Medicine | Admitting: Internal Medicine

## 2024-05-25 ENCOUNTER — Other Ambulatory Visit (HOSPITAL_COMMUNITY): Payer: Self-pay

## 2024-05-25 DIAGNOSIS — Z3201 Encounter for pregnancy test, result positive: Secondary | ICD-10-CM

## 2024-05-25 DIAGNOSIS — Z3A00 Weeks of gestation of pregnancy not specified: Secondary | ICD-10-CM | POA: Insufficient documentation

## 2024-05-25 DIAGNOSIS — Z758 Other problems related to medical facilities and other health care: Secondary | ICD-10-CM | POA: Diagnosis not present

## 2024-05-25 DIAGNOSIS — I10 Essential (primary) hypertension: Secondary | ICD-10-CM | POA: Diagnosis not present

## 2024-05-25 DIAGNOSIS — Z603 Acculturation difficulty: Secondary | ICD-10-CM

## 2024-05-25 DIAGNOSIS — Z21 Asymptomatic human immunodeficiency virus [HIV] infection status: Secondary | ICD-10-CM | POA: Diagnosis not present

## 2024-05-25 DIAGNOSIS — O98711 Human immunodeficiency virus [HIV] disease complicating pregnancy, first trimester: Secondary | ICD-10-CM | POA: Diagnosis not present

## 2024-05-25 DIAGNOSIS — O10011 Pre-existing essential hypertension complicating pregnancy, first trimester: Secondary | ICD-10-CM | POA: Insufficient documentation

## 2024-05-25 LAB — POCT URINE PREGNANCY: Preg Test, Ur: POSITIVE — AB

## 2024-05-25 MED ORDER — PRENATAL 27-1 MG PO TABS
1.0000 | ORAL_TABLET | Freq: Every day | ORAL | 3 refills | Status: DC
  Filled 2024-05-25 – 2024-05-26 (×2): qty 30, 30d supply, fill #0

## 2024-05-25 MED ORDER — NIFEDIPINE ER OSMOTIC RELEASE 30 MG PO TB24
30.0000 mg | ORAL_TABLET | Freq: Every day | ORAL | 1 refills | Status: DC
  Filled 2024-05-25: qty 30, 30d supply, fill #0

## 2024-05-25 NOTE — MAU Provider Note (Signed)
 Swahili INT AMN #  [phone] Audio/Visual used during the encounter     S [name] is a 33 y.o. G6P5 patient who presents to MAU today with complaint of needs to establish Prenatal Care. She denies any VB, LOF, or abdominal cramping and recently found out she was pregnant today   Patient has a complicated Medical History and pregnancy will be complicated by maternal HIV and HTN. Patient reports she had a confirmation of pregnancy completed at Urgent Care today ( See Notes) Patient needs a new antihypertensive and a list of OB providers     O BP 124/82 (BP Location: Right Arm)   Pulse 87   Temp 98.2 F (36.8 C) (Oral)   Resp 18   Wt 76 kg   LMP 02/12/2024 (Approximate)   SpO2 100%   BMI 32.73 kg/m  Physical Exam Vitals and nursing note reviewed.  Constitutional:      General: She is not in acute distress.    Appearance: Normal appearance. She is obese. She is not ill-appearing.  HENT:     Head: Normocephalic.     Nose: Nose normal.     Mouth/Throat:     Mouth: Mucous membranes are moist.  Cardiovascular:     Rate and Rhythm: Normal rate.  Pulmonary:     Effort: Pulmonary effort is normal.  Musculoskeletal:     Cervical back: Normal range of motion.  Skin:    General: Skin is warm.  Neurological:     Mental Status: She is alert and oriented to person, place, and time.  Psychiatric:        Mood and Affect: Mood normal.        Behavior: Behavior normal.     MDM  MODERATE  Discussed with patient she needs to switch her antihypertensives to Procardia  30 XL PO Daily prescription sent to pharmacy and continue her Biktarvy .    Patient was also given a list of OB providers in the area as well as a list of safe medications to take in pregnancy and a list of common questions patients have during pregnancy. She verbalized understanding and will schedule a a new high risk OB appointment within the week   Orders Placed This Encounter  Procedures   Discharge  patient Discharge disposition: 01-Home or Self Care; Discharge patient date: 05/25/2024    Standing Status:   Standing    Number of Occurrences:   1    Discharge disposition:   01-Home or Self Care [1]    Discharge patient date:   05/25/2024      Results for orders placed or performed during the hospital encounter of 05/25/24 (from the past 24 hours)  POCT urine pregnancy     Status: Abnormal   Collection Time: 05/25/24  9:43 AM  Result Value Ref Range   Preg Test, Ur Positive (A) Negative     I have reviewed the patient chart and performed the physical exam . Medications ordered as stated below.  A/P as described below.  Counseling and education provided and patient agreeable  with plan as described below. Verbalized understanding.    ASSESSMENT Medical screening exam complete  1. Positive urine pregnancy test (Primary)  2. Essential hypertension  3. Language barrier    PLAN Future Appointments  Date Time Provider Department Center  06/03/2024 11:15 AM Melvenia Corean SAILOR, NP [facility]  06/10/2024  1:30 PM [name], MD [facility] None    Discharge from MAU in stable condition  See AVS for full description of educational information and instructions provided to the patient at time of discharge  List of options for follow-up given   Warning signs for worsening condition that would warrant emergency follow-up discussed Patient may return to MAU as needed   [name] LABOR, NP 05/25/2024 1:00 PM

## 2024-05-25 NOTE — ED Notes (Signed)
 Via AMN video interpreter [phone]: Pt wishes to confirm pregnancy. States had positive home pregnancy test in April 2025; has not seen an OB. Denies any abd pain or vaginal bleeding.

## 2024-05-25 NOTE — MAU Note (Signed)
 Caitlin Marks is a 33 y.o. at Unknown here in MAU reporting: she would like to begin prenatal care and needs a OB/GYN.  Denies pain and VB.  LMP: April 2025 Onset of complaint: today Pain score: 0 Vitals:   05/25/24 1052  BP: 124/82  Pulse: 87  Resp: 18  Temp: 98.2 F (36.8 C)  SpO2: 100%     FHT: attempted, None heard  Lab orders placed from triage: None

## 2024-05-25 NOTE — Discharge Instructions (Signed)
 [facility] [employer] for [employer] at [employer] for [facility]    Phone: [phone]  Center for [employer] at [place]   Phone: [phone]  [facility] for [employer] at [place]  Phone: [phone]  [facility] for [employer] at [employer]  Phone: [phone]  [facility] for [employer] at [place]  Phone: [phone]  [facility] at [facility]   Phone: [phone]  [facility]       Phone: [phone]  [facility]    Phone: [phone]   [facility]    Phone: [phone]  [facility]    Phone: [phone]  [facility]    Phone: [phone]  [facility]       Phone: [phone]   [facility]  Phone: [phone]  [facility]    Phone: [phone]  [facility] [place]   Phone: [phone]  [facility]      Phone: [phone]  [facility]    Phone: [phone]    Commonly Asked Questions During Pregnancy  How Will I Feel When I'm Pregnant? Pregnancy symptoms in the first trimester of pregnancy may not appear until the middle or end of the second month. Hormonal changes will cause tenderness in your breasts, and you may begin to feel more tired than usual. Food cravings, an increase in the need to urinate, and morning sickness may all be more noticeable.  Pregnancy symptoms in the second trimester are more prominent. You may start to feel the baby move and become more active. Dental issues, nasal/sinus problems, and skin irritations can begin to appear. Heartburn, leg cramps, dizziness, and a vaginal discharge are also common. Every woman is different when it comes to the symptoms they experience, and some may not experience any at all. Pregnancy symptoms in the  third trimester can include increased frequency in urination, leg cramps, constipation, ligament pain in the abdomen, and weight gain. Back pain and Braxton Hicks contractions will become increasingly more common.  Why is nutrition during pregnancy important? Eating well is one of the best things you can do during pregnancy. Good nutrition helps you handle the extra demands on your body as your pregnancy progresses. The goal is to balance getting enough nutrients to support the growth of your fetus and maintaining a healthy weight.  How much water should I drink during pregnancy? During pregnancy you should drink 8 to 12 cups (64 to 96 ounces) of water every day. Water has many benefits. It aids digestion and helps form the amniotic fluid around the fetus. Water also helps nutrients circulate in the body and helps waste leave the body.  What can I do to help with nausea? Eat dry toast or crackers in the morning before you get out of bed to avoid moving around on an empty stomach. Eat five or six "mini meals" a day to ensure that your stomach is never empty. Eat frequent bites of foods like nuts, fruits, or crackers.  What can help with constipation during pregnancy? Constipation is common near the end of pregnancy. Eating more foods with fiber can help fight constipation. Fiber is found in fruits, vegetables, whole grains, beans, nuts, and seeds. You should aim for about 25 grams of fiber in your diet each day. Drink a lot of water as you increase your fiber intake.  How much coffee can I drink while  I'm pregnant? Research suggests that moderate caffeine consumption (less than 200 milligrams per day) does not cause miscarriage or preterm birth. That's the amount in one 12-ounce cup of coffee. Remember that caffeine also is found in tea, chocolate, energy drinks, and soft drinks. Caffeine can interfere with sleep and contribute to nausea and light-headedness. Caffeine also can increase urination and  lead to dehydration.  What can I do to prevent or ease back pain during pregnancy? There are several things you can do to prevent or ease back pain. For example, wear supportive clothing and shoes. Pay attention to your position when sitting, sleeping, and lifting things. If you need to stand for a long time, rest one foot on a stool or a box to take the strain off your back. You also can use heat or cold to soothe sore muscles.  Is it safe to exercise during pregnancy? If you are healthy and your pregnancy is normal, it is safe to continue or start regular physical activity. Physical activity does not increase your risk of miscarriage, low birth weight, or early delivery. It's still important to discuss exercise with your ob-gyn provider during your early prenatal visits.   What are the benefits of exercise during pregnancy? Regular exercise during pregnancy benefits you and your fetus in these key ways: Reduces back pain Eases constipation May decrease your risk of gestational diabetes, preeclampsia, and cesarean birth Promotes healthy weight gain during pregnancy Improves your overall fitness and strengthens your heart and blood vessels Helps you to lose the baby weight after your baby is born  Is it safe to dye my hair during pregnancy? Yes, it's safe. Only a small amount of chemicals from hair dye is absorbed through the scalp.  Is it safe to keep a cat during pregnancy? Yes, you can keep your cat. You may have heard that cat feces can carry the infection toxoplasmosis. This infection is only found in cats who go outdoors and hunt prey, such as mice and other rodents. If you do have a cat who goes outdoors or eats prey, have someone else take over daily cleaning the litter box. This will keep you away from any cat feces. If you have an indoor cat who only eats cat food and doesn't have contact with outside animals, your risk of toxoplasmosis is very low.  What substances should I avoid  during pregnancy? During pregnancy, women should not use tobacco, alcohol, marijuana, illegal drugs, or prescription medications for nonmedical reasons. Avoiding these substances and getting regular prenatal care are important to having a healthy pregnancy and a healthy baby.   What foods to I need to avoid in pregnancy? To help prevent listeriosis, avoid eating the following foods while you are pregnant: Unpasteurized milk and foods made with unpasteurized milk, including soft cheeses Hot dogs and luncheon meats, unless they are heated until steaming hot just before serving Unwashed raw produce such as fruits and vegetables  Avoid all raw and undercooked seafood, eggs, meat, and poultry while you are pregnant. Do not eat sushi made with raw fish (cooked sushi is safe). Cooking and pasteurization are the only ways to kill Listeria.  Limit your exposure to mercury by not eating bigeye tuna, king mackerel, marlin, orange roughy, shark, swordfish, or tilefish. Limit eating white (albacore) tuna to 6 ounces a week. You do not have to avoid all fish during pregnancy. In fact, fish and shellfish are nutritious foods with vital nutrients for a pregnant woman and her fetus. Be sure  to eat at least 8-12 ounces of low-mercury fish and shellfish per week.  Is travel safe to during pregnancy? In most cases, pregnant women can travel safely until close to their due dates. But travel may not be recommended for women who have pregnancy complications. If you are planning a trip, talk with your (ob-gyn) provider. And no matter how you choose to travel, think ahead about your comfort and safety.  Can I use a sauna or hot tub early in pregnancy? It's best not to. Your core body temperature rises when you use saunas and hot tubs. This rise in temperature can be harmful for your fetus.  Can I get a massage while pregnant? Yes. Massage is a good way to relax and improve circulation. The best position for a massage  while you're pregnant is lying on your side, rather than facedown. Some massage tables have a cut-out for the belly, allowing you to lie facedown comfortably. Tell your massage therapist that you're pregnant if you're not showing yet. Many health spas offer special prenatal massages done by therapists who are trained to work on pregnant women.  Is Having Dental Work While Pregnant Safe? Pregnancy and dental work questions are common for expecting moms. Preventive dental cleanings and annual exams during pregnancy are not only safe but are recommended. The rise in hormone levels during pregnancy causes the gums to swell, bleed, and trap food causing increased irritation to your gums. Preventive dental work while pregnant is essential to avoid oral infections such as gum disease, which has been linked to preterm birth. The American Dental Association (ADA) recommends pregnant women eat a balanced diet, brush their teeth thoroughly with ADA-approved fluoride toothpaste twice a day, and floss daily. Have preventive exams and cleanings during your pregnancy. Let your dentist know you are pregnant. Postpone non-emergency dental work until the second trimester or after delivery, if possible. Elective procedures should be postponed until after the delivery.   Safe Medications in Pregnancy   Acne:  Benzoyl Peroxide  Salicylic Acid   Backache/Headache:  Tylenol: 2 regular strength every 4 hours OR               2 Extra strength every 6 hours   Colds/Coughs/Allergies:  Benadryl (alcohol free) 25 mg every 6 hours as needed  Breath right strips  Claritin  Cepacol throat lozenges  Chloraseptic throat spray  Cold-Eeze- up to three times per day  Cough drops, alcohol free  Flonase (by prescription only)  Guaifenesin  Mucinex  Robitussin DM (plain only, alcohol free)  Saline nasal spray/drops  Sudafed (pseudoephedrine) & Actifed * use only after [redacted] weeks gestation and if you do not have high blood  pressure  Tylenol  Vicks Vaporub  Zinc lozenges  Zyrtec   Constipation:  Colace  Ducolax suppositories  Fleet enema  Glycerin suppositories  Metamucil  Milk of magnesia  Miralax  Senokot  Smooth move tea   Diarrhea:  Kaopectate  Imodium A-D   *NO pepto Bismol   Hemorrhoids:  Anusol  Anusol HC  Preparation H  Tucks   Indigestion:  Tums  Maalox  Mylanta  Zantac  Pepcid   Insomnia:  Benadryl (alcohol free) 25mg  every 6 hours as needed  Tylenol PM  Unisom, no Gelcaps   Leg Cramps:  Tums  MagGel   Nausea/Vomiting:  Bonine  Dramamine  Emetrol  Ginger extract  Sea bands  Meclizine  Nausea medication to take during pregnancy:  Unisom (doxylamine succinate 25 mg tablets) Take one  tablet daily at bedtime. If symptoms are not adequately controlled, the dose can be increased to a maximum recommended dose of two tablets daily (1/2 tablet in the morning, 1/2 tablet mid-afternoon and one at bedtime).  Vitamin B6 100mg  tablets. Take one tablet twice a day (up to 200 mg per day).   Skin Rashes:  Aveeno products  Benadryl cream or 25mg  every 6 hours as needed  Calamine Lotion  1% cortisone cream   Yeast infection:  Gyne-lotrimin 7  Monistat 7    **If taking multiple medications, please check labels to avoid duplicating the same active ingredients  **take medication as directed on the label  ** Do not exceed 4000 mg of tylenol in 24 hours  **Do not take medications that contain aspirin or ibuprofen

## 2024-05-25 NOTE — ED Provider Notes (Signed)
 [facility]    CSN: [id number] Arrival date & time: 05/25/24  0850      History   Chief Complaint Chief Complaint  Patient presents with   Possible Pregnancy   Today's encounter was completed with the assistance of a virtual Swahili interpreter  HPI Caitlin Marks is a 33 y.o. female presenting today with concern for possible pregnancy.  Her past medical history is significant for HIV and HTN.  She is currently prescribed Biktarvy .  Viral load undetectable on labs from December.  Last menstrual period was in April.  Documented OB history G6, P5.  She states that she has 2 living children.  Denies previous obstetric complications and miscarriages.  She is asymptomatic currently but has noted that she started showing.  Denies abdominal/pelvic pain, vaginal bleeding, or abnormal discharge.  She has not completed any pregnancy testing prior to today.   Past Medical History:  Diagnosis Date   HIV (human immunodeficiency virus infection) (HCC)    HIV (human immunodeficiency virus infection) (HCC) 02/26/2022    Patient Active Problem List   Diagnosis Date Noted   Positive pregnancy test 05/25/2024   Primary hypertension 04/15/2023   Racing heart beat 04/09/2022   HIV (human immunodeficiency virus infection) (HCC) 02/26/2022   Healthcare maintenance 02/26/2022    Past Surgical History:  Procedure Laterality Date   NO PAST SURGERIES      OB History     Gravida  6   Para  5   Term      Preterm      AB      Living  2      SAB      IAB      Ectopic      Multiple      Live Births               Home Medications    Prior to Admission medications   Medication Sig Start Date End Date Taking? Authorizing Provider  amLODipine  (NORVASC ) 5 MG tablet Take 1 tablet (5 mg total) by mouth daily. 02/22/24   [name], MD  bictegravir-emtricitabine -tenofovir  AF (BIKTARVY ) 50-200-25 MG TABS tablet Take 1 tablet by mouth daily. Try to take at  the same time each day with or without food. 05/02/24   Melvenia Corean SAILOR, NP  valsartan  (DIOVAN ) 40 MG tablet Take 1 tablet (40 mg) by mouth daily. 02/22/24   [name], MD    Family History Family History  Problem Relation Age of Onset   Other Mother    Diabetes Neg Hx    Cancer Neg Hx     Social History Social History   Tobacco Use   Smoking status: Never    Passive exposure: Past   Smokeless tobacco: Never  Vaping Use   Vaping status: Never Used  Substance Use Topics   Alcohol use: Not Currently    Comment: reports 2-3 bottles beer on some weekends   Drug use: Never     Allergies   Patient has no known allergies.   Review of Systems Review of Systems  Constitutional:  Negative for chills and fever.  HENT:  Negative for ear pain and sore throat.   Eyes:  Negative for pain and visual disturbance.  Respiratory:  Negative for cough and shortness of breath.   Cardiovascular:  Negative for chest pain and palpitations.  Gastrointestinal:  Negative for abdominal pain and vomiting.  Genitourinary:  Negative for dysuria, hematuria, pelvic pain, vaginal  bleeding, vaginal discharge and vaginal pain.  Musculoskeletal:  Negative for arthralgias and back pain.  Skin:  Negative for color change and rash.  Neurological:  Negative for seizures and syncope.  All other systems reviewed and are negative.  Physical Exam Triage Vital Signs ED Triage Vitals [05/25/24 0927]  Encounter Vitals Group     BP 112/83     Girls Systolic BP Percentile      Girls Diastolic BP Percentile      Boys Systolic BP Percentile      Boys Diastolic BP Percentile      Pulse Rate 88     Resp 16     Temp 98 F (36.7 C)     Temp Source Oral     SpO2 99 %     Weight      Height      Head Circumference      Peak Flow      Pain Score      Pain Loc      Pain Education      Exclude from Growth Chart    Updated Vital Signs BP 112/83   Pulse 88   Temp 98 F (36.7 C) (Oral)   Resp 16    LMP 02/13/2024 (Approximate)   SpO2 99%   Physical Exam Vitals and nursing note reviewed.  Constitutional:      General: She is not in acute distress.    Appearance: She is well-developed.  HENT:     Head: Normocephalic and atraumatic.     Nose: Nose normal.  Eyes:     Conjunctiva/sclera: Conjunctivae normal.  Cardiovascular:     Rate and Rhythm: Normal rate and regular rhythm.     Heart sounds: No murmur heard. Pulmonary:     Effort: Pulmonary effort is normal. No respiratory distress.     Breath sounds: Normal breath sounds.  Abdominal:     General: Bowel sounds are normal.     Palpations: Abdomen is soft.     Tenderness: There is no abdominal tenderness. There is no guarding.  Musculoskeletal:        General: No swelling.     Cervical back: Neck supple.  Skin:    General: Skin is warm and dry.     Capillary Refill: Capillary refill takes less than 2 seconds.  Neurological:     Mental Status: She is alert.  Psychiatric:        Mood and Affect: Mood normal.    UC Treatments / Results  Labs (all labs ordered are listed, but only abnormal results are displayed) Labs Reviewed  POCT URINE PREGNANCY - Abnormal; Notable for the following components:      Result Value   Preg Test, Ur Positive (*)    All other components within normal limits   Initial Impression / Assessment and Plan / UC Course  I have reviewed the triage vital signs and the nursing notes.  Pertinent labs & imaging results that were available during my care of the patient were reviewed by me and considered in my medical decision making (see chart for details).  Patient presenting for confirmation of pregnancy.  LMP in April.  She is asymptomatic currently.  POC urine pregnancy test completed today is positive.  She has been given a handout for the MAU.  Staff will guide her to MAU to establish obstetric care.  I have recommended starting a prenatal vitamin and also discussing an alternative to valsartan   for management of HTN  in the setting of pregnancy.    Final Clinical Impressions(s) / UC Diagnoses   Final diagnoses:  Positive pregnancy test   Discharge Instructions   None    ED Prescriptions   None    PDMP not reviewed this encounter.   Melvenia Manus BRAVO, MD 05/25/24 [phone]

## 2024-05-26 ENCOUNTER — Other Ambulatory Visit (HOSPITAL_COMMUNITY): Payer: Self-pay

## 2024-05-26 ENCOUNTER — Other Ambulatory Visit: Payer: Self-pay

## 2024-05-28 NOTE — Congregational Nurse Program (Signed)
  Dept: [phone]   Congregational Nurse Program Note  Date of Encounter: 05/28/2024  Past Medical History: Past Medical History:  Diagnosis Date   HIV (human immunodeficiency virus infection) (HCC)    HIV (human immunodeficiency virus infection) (HCC) 02/26/2022    Encounter Details:  Community Questionnaire - 05/28/24 1231       Questionnaire   Ask client: Do you give verbal consent for me to treat you today? Yes    Student Assistance N/A    Location Patient Served  [facility]    Encounter Setting Phone/Text/Email    Population Status Migrant/Refugee    Insurance Medicaid    Insurance/Financial Assistance Referral Charitable [facility]    Medication Have Medication Insecurities;Patient Medications Reviewed    Medical Provider Yes    Screening Referrals Made N/A    Medical Referrals Made [facility]    Medical Appointment Completed [facility] P[facility]    CNP Interventions Advocate/Support;Navigate Healthcare System;Case Management;Counsel;Educate    Screenings CN Performed N/A    ED Visit Averted Yes    Life-Saving Intervention Made N/A         I have reviewed upcoming appointments with patient per request. Date,time and addresses communicated. Patient is pregnant and importance of keeping up with these appointments emphasized.  Naomie Ciaran Begay RN BSN PCCN  [facility] [phone]-cell [phone]-office

## 2024-05-30 ENCOUNTER — Other Ambulatory Visit: Payer: Self-pay | Admitting: Infectious Diseases

## 2024-05-30 ENCOUNTER — Other Ambulatory Visit: Payer: Self-pay

## 2024-05-30 DIAGNOSIS — Z21 Asymptomatic human immunodeficiency virus [HIV] infection status: Secondary | ICD-10-CM

## 2024-05-30 NOTE — Telephone Encounter (Signed)
 30 day supply dispensed on 6/26. Additional refills to be provided at 7/25 appt.   Jalee Saine, BSN, RN

## 2024-05-30 NOTE — Progress Notes (Signed)
 Specialty Pharmacy Refill Coordination Note  Caitlin Marks is a 33 y.o. female was contacted today via interpreter line regarding refills of specialty medication(s) Bictegravir-Emtricitab-Tenofov (Biktarvy )   Patient requested Delivery   Delivery date: 06/06/24   Verified address: [address] [place]   Medication will be filled on 06/03/24.   Patient is aware refill was denied as patient needs an appointment. Patient as an appointment 06/03/24 at 11:15am medication will be processed and shipped once their provider renewed medication.

## 2024-06-03 ENCOUNTER — Other Ambulatory Visit: Payer: Self-pay

## 2024-06-03 ENCOUNTER — Ambulatory Visit: Admitting: Infectious Diseases

## 2024-06-03 VITALS — BP 131/90 | HR 95 | Temp 100.2°F | Wt 164.0 lb

## 2024-06-03 DIAGNOSIS — Z3A16 16 weeks gestation of pregnancy: Secondary | ICD-10-CM

## 2024-06-03 DIAGNOSIS — O10012 Pre-existing essential hypertension complicating pregnancy, second trimester: Secondary | ICD-10-CM | POA: Diagnosis not present

## 2024-06-03 DIAGNOSIS — O98712 Human immunodeficiency virus [HIV] disease complicating pregnancy, second trimester: Secondary | ICD-10-CM

## 2024-06-03 DIAGNOSIS — Z21 Asymptomatic human immunodeficiency virus [HIV] infection status: Secondary | ICD-10-CM | POA: Diagnosis not present

## 2024-06-03 MED ORDER — BIKTARVY 50-200-25 MG PO TABS
1.0000 | ORAL_TABLET | Freq: Every day | ORAL | 11 refills | Status: DC
Start: 2024-06-03 — End: 2024-08-12
  Filled 2024-06-03: qty 30, 30d supply, fill #0
  Filled 2024-06-24 – 2024-06-27 (×2): qty 30, 30d supply, fill #1
  Filled 2024-07-26: qty 30, 30d supply, fill #2

## 2024-06-03 NOTE — Patient Instructions (Addendum)
  The only blood pressure medication you should take is : NIFEDIPINE    Please continue your Biktarvy  everyday   Center for [facility]  Address: [address], [place], [place] [address] Phone: [phone]  Please separate the prenatal vitamin by 6 hours so not in the stomach together at the same time    [facility] [place] ya D[place]:  Dawa pekee ya shinikizo la damu unayopaswa kuchukua ni NIFEDIPINE .  Tafadhali endelea kuchukua Biktarvy  kila siku kama ilivyoagizwa.  Chukua vitamin ya ujauzito yako angalau masaa 6 tofauti na dawa nyingine ili kuepuka matatizo tumboni.

## 2024-06-03 NOTE — Progress Notes (Signed)
 Name: Caitlin Marks  DOB: [birth year] MRN: [id number] PCP: [name], MD     Brief Narrative:  Caitlin Marks is a 33 y.o. female with HIV. Dx 2018. 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 Discussed the use of AI scribe software for clinical note transcription with the patient, who gave verbal consent to proceed.  History of Present Illness   Caitlin Marks is a 33 year old female with HIV who presents for a follow-up regarding her pregnancy and medication management.  She recently discovered she is pregnant, with her last menstrual period in early April, suggesting a due date around January 13th. She feels okay, is currently hungry, and not excessively tired. She has two children and is indifferent to the gender of the new baby, prioritizing a safe delivery.  She is currently taking Biktarvy  for HIV, which she has run out of and is seeking a refill. Her medication is usually delivered to her address due to lack of transportation. She is also on a new blood pressure medication prescribed at urgent care last week, which is safe for her pregnancy.  She has an appointment scheduled with a gynecologist at the [facility] on July 29th at 11:15 AM.          02/22/2024    9:07 AM  Depression screen PHQ 2/9  Decreased Interest 0  Down, Depressed, Hopeless 0  PHQ - 2 Score 0  Altered sleeping 0  Tired, decreased energy 0  Change in appetite 0  Feeling bad or failure about yourself  0  Trouble  concentrating 0  Moving slowly or fidgety/restless 0  Suicidal thoughts 0  PHQ-9 Score 0  Difficult doing work/chores Not difficult at all    Review of Systems  Constitutional:  Negative for chills and fever.  HENT:  Negative for tinnitus.   Eyes:  Negative for blurred vision and photophobia.  Respiratory:  Negative for cough and sputum production.   Cardiovascular:  Negative for chest pain.  Gastrointestinal:  Negative for diarrhea, nausea and vomiting.  Genitourinary:  Negative for dysuria.  Skin:  Negative for rash.  Neurological:  Negative for headaches.     Past Medical History:  Diagnosis Date   HIV (human immunodeficiency virus infection) (HCC)    HIV (human immunodeficiency virus infection) (HCC) 02/26/2022    Outpatient Medications Prior to Visit  Medication Sig Dispense Refill   Prenatal 27-1 MG TABS Take 1 tablet by mouth daily. 60 tablet 3   bictegravir-emtricitabine -tenofovir  AF (BIKTARVY ) 50-200-25 MG TABS tablet Take 1 tablet by mouth daily. Try to take at the same time each day with or without food. 30 tablet 0   benzonatate (TESSALON) 100 MG capsule Take 1 capsule (100 mg total) by mouth every 8 (eight) hours. (Patient not taking: Reported on 06/07/2024) 21 capsule 0   Guaifenesin 1200 MG TB12 Take 1 tablet (1,200 mg total) by mouth in the morning and at bedtime. (Patient not taking: Reported on 06/07/2024) 14 tablet 0  NIFEdipine  (PROCARDIA -XL/NIFEDICAL-XL) 30 MG 24 hr tablet Take 1 tablet (30 mg total) by mouth daily. 30 tablet 1   promethazine-dextromethorphan (PROMETHAZINE-DM) 6.25-15 MG/5ML syrup Take 5 mLs by mouth at bedtime as needed for cough. (Patient not taking: Reported on 06/07/2024) 118 mL 0   No facility-administered medications prior to visit.     No Known Allergies  Social History   Tobacco Use   Smoking status: Never    Passive exposure: Past   Smokeless tobacco: Never  Vaping Use   Vaping status: Never Used  Substance Use Topics    Alcohol use: Not Currently    Comment: reports 2-3 bottles beer on some weekends   Drug use: Never    Family History  Problem Relation Age of Onset   Other Mother    Diabetes Neg Hx    Cancer Neg Hx     Social History   Substance and Sexual Activity  Sexual Activity Yes   Birth control/protection: Injection     Objective:   Vitals:   06/03/24 1044  BP: (!) 131/90  Pulse: 95  Temp: 100.2 F (37.9 C)  TempSrc: Oral  SpO2: 100%  Weight: 164 lb (74.4 kg)   Body mass index is 32.03 kg/m.  Physical Exam Constitutional:      Appearance: Normal appearance. She is not ill-appearing.  HENT:     Mouth/Throat:     Mouth: Mucous membranes are moist.     Pharynx: Oropharynx is clear.  Eyes:     General: No scleral icterus. Cardiovascular:     Rate and Rhythm: Normal rate.  Pulmonary:     Effort: Pulmonary effort is normal.  Neurological:     Mental Status: She is oriented to person, place, and time.  Psychiatric:        Mood and Affect: Mood normal.        Thought Content: Thought content normal.     Lab Results Lab Results  Component Value Date   WBC 5.0 05/04/2023   HGB 13.6 05/04/2023   HCT 40.8 05/04/2023   MCV 84.1 05/04/2023   PLT 230 05/04/2023    Lab Results  Component Value Date   CREATININE 0.87 10/29/2023   BUN 7 10/29/2023   NA 137 10/29/2023   K 4.0 10/29/2023   CL 103 10/29/2023   CO2 22 10/29/2023     Lab Results  Component Value Date   ALT 18 10/29/2023   AST 24 10/29/2023   ALKPHOS 72 04/15/2023   BILITOT 0.6 10/29/2023    Lab Results  Component Value Date   CHOL 144 04/15/2023   HDL 52 04/15/2023   LDLCALC 79 04/15/2023   TRIG 64 04/15/2023   CHOLHDL 2.8 04/15/2023   HIV 1 RNA Quant  Date Value  06/03/2024 50 copies/mL (H)  10/29/2023 <20 Copies/mL (H)  05/04/2023 <20 Copies/mL (H)   CD4 T Cell Abs (/uL)  Date Value  05/04/2023 1,187     Assessment & Plan:     HIV infection - HIV infection managed with  Biktarvy , safe for use during pregnancy. Emphasized adherence to prevent vertical transmission. <1% risk of transmission to the baby with continued medication adherence. Current medication supply depleted. - Send prescription refills to [facility] for home delivery - Perform blood work today to check viral load - Schedule follow-up appointment in October to repeat blood work and discus deliver plays, monitor throughout gestation   Pregnancy, Newly Diagnosed -  Confirmed pregnancy with estimated due date of  November 22, 2024. Blood pressure medication adjusted to a pregnancy-safe option. Prioritizes safe delivery. - Continue current blood pressure medication as prescribed - Attend gynecology appointment on June 07, 2024 at 11:15 AM at the [facility] - Ensure safe delivery by maintaining regular prenatal care - Separate prenatal vitamin from biktarvy  by 6 hours   Hypertension - Hypertension managed with a pregnancy-safe medication. Reinforced only to take Nifedipine  at this time until her prenatal appointment.  - Continue nifedipine  30 mg daily as prescribed      Meds ordered this encounter  Medications   bictegravir-emtricitabine -tenofovir  AF (BIKTARVY ) 50-200-25 MG TABS tablet    Sig: Take 1 tablet by mouth daily. Try to take at the same time each day with or without food.    Dispense:  30 tablet    Refill:  11    Prescription Type::   Renewal   Orders Placed This Encounter  Procedures   HIV 1 RNA quant-no reflex-bld   T-helper cells (CD4) count   Return in about 3 months (around 09/03/2024).   Corean Fireman, MSN, NP-C [facility] for Infectious Disease [facility] Pager: [phone] Office: [phone]  06/08/24  10:09 AM

## 2024-06-06 LAB — T-HELPER CELLS (CD4) COUNT (NOT AT ARMC)
Absolute CD4: 880 {cells}/uL (ref 490–1740)
CD4 T Helper %: 52 % (ref 30–61)
Total lymphocyte count: 1693 {cells}/uL (ref 850–3900)

## 2024-06-06 LAB — HIV-1 RNA QUANT-NO REFLEX-BLD
HIV 1 RNA Quant: 50 {copies}/mL — ABNORMAL HIGH
HIV-1 RNA Quant, Log: 1.7 {Log_copies}/mL — ABNORMAL HIGH

## 2024-06-07 ENCOUNTER — Other Ambulatory Visit: Payer: Self-pay

## 2024-06-07 ENCOUNTER — Other Ambulatory Visit (HOSPITAL_COMMUNITY)
Admission: RE | Admit: 2024-06-07 | Discharge: 2024-06-07 | Disposition: A | Discharge status: Home / Self Care | Source: Ambulatory Visit | Attending: Family Medicine | Admitting: Family Medicine

## 2024-06-07 ENCOUNTER — Ambulatory Visit: Payer: Self-pay | Admitting: Infectious Diseases

## 2024-06-07 ENCOUNTER — Ambulatory Visit

## 2024-06-07 VITALS — BP 118/81 | HR 99 | Wt 162.5 lb

## 2024-06-07 DIAGNOSIS — O0990 Supervision of high risk pregnancy, unspecified, unspecified trimester: Secondary | ICD-10-CM

## 2024-06-07 DIAGNOSIS — Z3A16 16 weeks gestation of pregnancy: Secondary | ICD-10-CM | POA: Diagnosis not present

## 2024-06-07 DIAGNOSIS — Z31430 Encounter of female for testing for genetic disease carrier status for procreative management: Secondary | ICD-10-CM

## 2024-06-07 NOTE — Telephone Encounter (Signed)
 Called patient with [employer], neither number is in service.   Stephaney Steven, BSN, RN

## 2024-06-07 NOTE — Progress Notes (Signed)
 New OB Intake  Patient came in person for New OB Intake.  Dorian Pack Swahili interpreter assist with the visit.   I discussed the limitations, risks, security and privacy concerns of performing an evaluation and management service by telephone and the availability of in person appointments. I also discussed with the patient that there may be a patient responsible charge related to this service. The patient expressed understanding and agreed to proceed.  I explained I am completing New OB Intake today. We discussed EDD of 11/18/2024 based on LMP of 02/12/2024. Pt is G3P2002. I reviewed her allergies, medications and Medical/Surgical/OB history.    Patient Active Problem List   Diagnosis Date Noted   Supervision of high risk pregnancy, antepartum 06/07/2024   Primary hypertension 04/15/2023   HIV (human immunodeficiency virus infection) (HCC) 02/26/2022     Concerns addressed today  Delivery Plans Plans to deliver at [facility]. Discussed the nature of our practice with multiple providers including residents and students. Due to the size of the practice, the delivering provider may not be the same as those providing prenatal care.   Patient is not interested in water birth.  MyChart/Babyscripts MyChart access verified. I explained pt will have some visits in office and some virtually. Babyscripts instructions given and order placed. Patient verifies receipt of registration text/e-mail. Account successfully created and app downloaded. If patient is a candidate for Optimized scheduling, add to sticky note.   Blood Pressure Cuff/Weight Scale Blood pressure cuff ordered for patient to pick-up from [employer]. Explained after first prenatal appt pt will check weekly and document in Babyscripts. Patient does not have weight scale; order sent to [facility], patient may track weight weekly in Babyscripts.  Anatomy US  Explained first scheduled US  will be around 19 weeks. Anatomy US   scheduled for 08/08/2024 at 8:00.  Is patient a CenteringPregnancy candidate?  Declined Not a candidate due to Language barrier   Is patient a Mom+Baby Combined Care candidate?  Not a candidate   If accepted, confirm patient does not intend to move from the area for at least 12 months, then notify Mom+Baby staff  Is patient a candidate for Babyscripts Optimization? No, due to Swahili language barrier.    First visit review I reviewed new OB appt with patient. Explained pt will be seen by Norleen LULLA Rover MD at first visit. Discussed Jennell genetic screening with patient. Yes [facility] and [facility].. Routine prenatal labs is   Last Pap No results found for: DIAGPAP  Tanazia Achee, CMA 06/07/2024  11:19 AM

## 2024-06-08 ENCOUNTER — Ambulatory Visit: Payer: Self-pay | Admitting: Family Medicine

## 2024-06-08 LAB — GC/CHLAMYDIA PROBE AMP (~~LOC~~) NOT AT ARMC
Chlamydia: NEGATIVE
Comment: NEGATIVE
Comment: NORMAL
Neisseria Gonorrhea: NEGATIVE

## 2024-06-08 LAB — PROTEIN / CREATININE RATIO, URINE
Creatinine, Urine: 152.7 mg/dL
Protein, Ur: 41.7 mg/dL
Protein/Creat Ratio: 273 mg/g{creat} — ABNORMAL HIGH (ref 0–200)

## 2024-06-08 LAB — TSH RFX ON ABNORMAL TO FREE T4: TSH: 0.904 u[IU]/mL (ref 0.450–4.500)

## 2024-06-09 ENCOUNTER — Telehealth: Payer: Self-pay | Admitting: Internal Medicine

## 2024-06-09 LAB — COMPREHENSIVE METABOLIC PANEL WITH GFR
ALT: 13 IU/L (ref 0–32)
AST: 22 IU/L (ref 0–40)
Albumin: 4 g/dL (ref 3.9–4.9)
Alkaline Phosphatase: 63 IU/L (ref 44–121)
BUN/Creatinine Ratio: 5 — ABNORMAL LOW (ref 9–23)
BUN: 4 mg/dL — ABNORMAL LOW (ref 6–20)
Bilirubin Total: 0.4 mg/dL (ref 0.0–1.2)
CO2: 15 mmol/L — ABNORMAL LOW (ref 20–29)
Calcium: 9.1 mg/dL (ref 8.7–10.2)
Chloride: 100 mmol/L (ref 96–106)
Creatinine, Ser: 0.76 mg/dL (ref 0.57–1.00)
Globulin, Total: 3.6 g/dL (ref 1.5–4.5)
Glucose: 82 mg/dL (ref 70–99)
Potassium: 4.1 mmol/L (ref 3.5–5.2)
Sodium: 132 mmol/L — ABNORMAL LOW (ref 134–144)
Total Protein: 7.6 g/dL (ref 6.0–8.5)
eGFR: 107 mL/min/1.73 (ref >59)

## 2024-06-09 LAB — CBC/D/PLT+RPR+RH+ABO+RUBIGG...
Antibody Screen: NEGATIVE
Basophils Absolute: 0 x10E3/uL (ref 0.0–0.2)
Basos: 1 %
EOS (ABSOLUTE): 0.1 x10E3/uL (ref 0.0–0.4)
Eos: 2 %
HCV Ab: NONREACTIVE
HIV Screen 4th Generation wRfx: REACTIVE
Hematocrit: 35.4 % (ref 34.0–46.6)
Hemoglobin: 11.8 g/dL (ref 11.1–15.9)
Hepatitis B Surface Ag: NEGATIVE
Immature Grans (Abs): 0 x10E3/uL (ref 0.0–0.1)
Immature Granulocytes: 0 %
Lymphocytes Absolute: 2.2 x10E3/uL (ref 0.7–3.1)
Lymphs: 35 %
MCH: 29.4 pg (ref 26.6–33.0)
MCHC: 33.3 g/dL (ref 31.5–35.7)
MCV: 88 fL (ref 79–97)
Monocytes Absolute: 0.5 x10E3/uL (ref 0.1–0.9)
Monocytes: 7 %
Neutrophils Absolute: 3.6 x10E3/uL (ref 1.4–7.0)
Neutrophils: 55 %
Platelets: 161 x10E3/uL (ref 150–450)
RBC: 4.02 x10E6/uL (ref 3.77–5.28)
RDW: 14.5 % (ref 11.7–15.4)
RPR Ser Ql: NONREACTIVE
Rh Factor: POSITIVE
Rubella Antibodies, IGG: 15.6 {index} (ref >0.99)
WBC: 6.4 x10E3/uL (ref 3.4–10.8)

## 2024-06-09 LAB — AFP, SERUM, OPEN SPINA BIFIDA
AFP MoM: 1.32
AFP Value: 49.3 ng/mL
Gest. Age on Collection Date: 16.6 wk
Maternal Age At EDD: 32 a
OSBR Risk 1 IN: 9058
Test Results:: NEGATIVE
Weight: 163 [lb_av]

## 2024-06-09 LAB — HCV INTERPRETATION

## 2024-06-09 LAB — HEMOGLOBIN A1C
Est. average glucose Bld gHb Est-mCnc: 97 mg/dL
Hgb A1c MFr Bld: 5 % (ref 4.8–5.6)

## 2024-06-09 LAB — HIV 1/2 AB DIFFERENTIATION
HIV 1 Ab: REACTIVE
HIV 2 Ab: NONREACTIVE
NOTE (HIV CONF MULTIP: POSITIVE — AB

## 2024-06-09 NOTE — Telephone Encounter (Signed)
 Called pt to confirm appt for 8/1 unable to LVM

## 2024-06-10 ENCOUNTER — Encounter: Payer: Self-pay | Admitting: Internal Medicine

## 2024-06-10 ENCOUNTER — Ambulatory Visit: Attending: Internal Medicine | Admitting: Internal Medicine

## 2024-06-10 VITALS — BP 101/66 | HR 104 | Temp 98.2°F | Ht 60.0 in | Wt 165.0 lb

## 2024-06-10 DIAGNOSIS — Z3A16 16 weeks gestation of pregnancy: Secondary | ICD-10-CM | POA: Diagnosis not present

## 2024-06-10 DIAGNOSIS — I10 Essential (primary) hypertension: Secondary | ICD-10-CM

## 2024-06-10 DIAGNOSIS — Z21 Asymptomatic human immunodeficiency virus [HIV] infection status: Secondary | ICD-10-CM | POA: Diagnosis not present

## 2024-06-10 NOTE — Progress Notes (Signed)
 Patient ID: Caitlin Marks, female    DOB: 02/14/1991  MRN: [id number]  CC: Hypertension (HTN f/u. /No questions / concerns/)   Subjective: Caitlin Marks is a 33 y.o. female who presents for chronic ds management. Her concerns today include:  Patient with history of HTN, HIV, tobacco dependence.   [employer]N Language interpreter used during this encounter. #[id number]  HTN: last seen 02/2024.  Norvasc  5 mg was added to Diovan  40 mg Recently found to be pregnant.  Both meds d/c.  Changed to Nifedipine  XL 30 mg daily.  She tells me she is 4 mths pregnant.  Limits salt in foods No device to check BP She recently saw her ID specialist for f/U HIV.  Levels remain undetectable. She remains on Biktarvy  Has appt with OB 06/20/2024.  No vaginal bleeding or abdominal pain. She has quit smoking.   Patient Active Problem List   Diagnosis Date Noted   Supervision of high risk pregnancy, antepartum 06/07/2024   Primary hypertension 04/15/2023   HIV (human immunodeficiency virus infection) (HCC) 02/26/2022     Current Outpatient Medications on File Prior to Visit  Medication Sig Dispense Refill   bictegravir-emtricitabine -tenofovir  AF (BIKTARVY ) 50-200-25 MG TABS tablet Take 1 tablet by mouth daily. Try to take at the same time each day with or without food. 30 tablet 11   NIFEdipine  (PROCARDIA -XL/NIFEDICAL-XL) 30 MG 24 hr tablet Take 1 tablet (30 mg total) by mouth daily. 30 tablet 1   Prenatal 27-1 MG TABS Take 1 tablet by mouth daily. 60 tablet 3   benzonatate (TESSALON) 100 MG capsule Take 1 capsule (100 mg total) by mouth every 8 (eight) hours. (Patient not taking: Reported on 06/10/2024) 21 capsule 0   Guaifenesin 1200 MG TB12 Take 1 tablet (1,200 mg total) by mouth in the morning and at bedtime. (Patient not taking: Reported on 06/10/2024) 14 tablet 0   promethazine-dextromethorphan (PROMETHAZINE-DM) 6.25-15 MG/5ML syrup Take 5 mLs by mouth at bedtime as needed for cough. (Patient not taking:  Reported on 06/10/2024) 118 mL 0   No current facility-administered medications on file prior to visit.    No Known Allergies  Social History   Socioeconomic History   Marital status: Single    Spouse name: Not on file   Number of children: Not on file   Years of education: Not on file   Highest education level: Not on file  Occupational History   Not on file  Tobacco Use   Smoking status: Never    Passive exposure: Past   Smokeless tobacco: Never  Vaping Use   Vaping status: Never Used  Substance and Sexual Activity   Alcohol use: Not Currently    Comment: reports 2-3 bottles beer on some weekends   Drug use: Never   Sexual activity: Yes    Birth control/protection: Injection  Other Topics Concern   Not on file  Social History Narrative   ** Merged History Encounter **       Social Drivers of Corporate investment banker Strain: Not on file  Food Insecurity: Not on file  Transportation Needs: Unmet Transportation Needs (02/04/2024)   PRAPARE - Administrator, Civil Service (Medical): Yes    Lack of Transportation (Non-Medical): Yes  Physical Activity: Not on file  Stress: Not on file  Social Connections: Not on file  Intimate Partner Violence: Not on file    Family History  Problem Relation Age of Onset   Other Mother  Diabetes Neg Hx    Cancer Neg Hx     Past Surgical History:  Procedure Laterality Date   NO PAST SURGERIES      ROS: Review of Systems Negative except as stated above  PHYSICAL EXAM: BP 101/66 (BP Location: Left Arm, Patient Position: Sitting, Cuff Size: Normal)   Pulse (!) 104   Temp 98.2 F (36.8 C) (Oral)   Ht 5' (1.524 m)   Wt 165 lb (74.8 kg)   LMP 02/12/2024 (Approximate)   SpO2 97%   BMI 32.22 kg/m   Physical Exam  General appearance - alert, well appearing, young female and in no distress Mental status - normal mood, behavior, speech, dress, motor activity, and thought processes Neck - supple, no  significant adenopathy Chest - clear to auscultation, no wheezes, rales or rhonchi, symmetric air entry Heart - normal rate, regular rhythm, normal S1, S2, no murmurs, rubs, clicks or gallops Extremities - peripheral pulses normal, no pedal edema, no clubbing or cyanosis      Latest Ref Rng & Units 06/07/2024   11:39 AM 10/29/2023   11:42 AM 04/15/2023   11:38 AM  CMP  Glucose 70 - 99 mg/dL 82  76  86   BUN 6 - 20 mg/dL 4  7  8    Creatinine 0.57 - 1.00 mg/dL 9.23  9.12  8.92   Sodium 134 - 144 mmol/L 132  137  140   Potassium 3.5 - 5.2 mmol/L 4.1  4.0  4.5   Chloride 96 - 106 mmol/L 100  103  103   CO2 20 - 29 mmol/L 15  22  25    Calcium 8.7 - 10.2 mg/dL 9.1  8.9  9.2   Total Protein 6.0 - 8.5 g/dL 7.6  7.7  7.6   Total Bilirubin 0.0 - 1.2 mg/dL 0.4  0.6  0.3   Alkaline Phos 44 - 121 IU/L 63   72   AST 0 - 40 IU/L 22  24  45   ALT 0 - 32 IU/L 13  18  31     Lipid Panel     Component Value Date/Time   CHOL 144 04/15/2023 1138   TRIG 64 04/15/2023 1138   HDL 52 04/15/2023 1138   CHOLHDL 2.8 04/15/2023 1138   LDLCALC 79 04/15/2023 1138    CBC    Component Value Date/Time   WBC 6.4 06/07/2024 1139   WBC 5.0 05/04/2023 1018   RBC 4.02 06/07/2024 1139   RBC 4.85 05/04/2023 1018   HGB 11.8 06/07/2024 1139   HCT 35.4 06/07/2024 1139   PLT 161 06/07/2024 1139   MCV 88 06/07/2024 1139   MCH 29.4 06/07/2024 1139   MCH 28.0 05/04/2023 1018   MCHC 33.3 06/07/2024 1139   MCHC 33.3 05/04/2023 1018   RDW 14.5 06/07/2024 1139   LYMPHSABS 2.2 06/07/2024 1139   EOSABS 0.1 06/07/2024 1139   BASOSABS 0.0 06/07/2024 1139    ASSESSMENT AND PLAN: 1. Essential hypertension (Primary) At goal on Nifedipine .  Continue this medication.  2. Asymptomatic HIV infection, with no history of HIV-related illness [facility]) Patient recently seen by her infectious disease specialist.  Biktarvy  was continued.  3. [redacted] weeks gestation of pregnancy Patient to continue prenatal vitamins. Keep upcoming  appointment with OB on the 11th of this month. Advised not to smoke, drink or use any street drugs during pregnancy.  Advised that if she develops any vaginal bleeding prior to seeing the OB specialist, she should be seen  in the emergency room.   Patient was given the opportunity to ask questions.  Patient verbalized understanding of the plan and was able to repeat key elements of the plan.   This documentation was completed using Paediatric nurse.  Any transcriptional errors are unintentional.  No orders of the defined types were placed in this encounter.    Requested Prescriptions    No prescriptions requested or ordered in this encounter    No follow-ups on file.  Barnie Louder, MD, FACP

## 2024-06-11 LAB — URINE CULTURE, OB REFLEX

## 2024-06-11 LAB — CULTURE, OB URINE

## 2024-06-12 LAB — PANORAMA PRENATAL TEST FULL PANEL:PANORAMA TEST PLUS 5 ADDITIONAL MICRODELETIONS: FETAL FRACTION: 5.2

## 2024-06-13 MED ORDER — SULFAMETHOXAZOLE-TRIMETHOPRIM 800-160 MG PO TABS
1.0000 | ORAL_TABLET | Freq: Two times a day (BID) | ORAL | 0 refills | Status: AC
Start: 2024-06-13 — End: 2024-06-20

## 2024-06-15 LAB — HORIZON CUSTOM: REPORT SUMMARY: POSITIVE — AB

## 2024-06-20 ENCOUNTER — Encounter: Payer: Self-pay | Admitting: Family Medicine

## 2024-06-24 ENCOUNTER — Other Ambulatory Visit: Payer: Self-pay

## 2024-06-27 ENCOUNTER — Other Ambulatory Visit: Payer: Self-pay

## 2024-06-29 ENCOUNTER — Other Ambulatory Visit: Payer: Self-pay | Admitting: Pharmacy Technician

## 2024-06-29 ENCOUNTER — Other Ambulatory Visit (HOSPITAL_COMMUNITY): Payer: Self-pay

## 2024-06-29 ENCOUNTER — Other Ambulatory Visit: Payer: Self-pay

## 2024-06-29 NOTE — Progress Notes (Signed)
 Specialty Pharmacy Refill Coordination Note  Caitlin Marks is a 33 y.o. female contacted today regarding refills of specialty medication(s) Bictegravir-Emtricitab-Tenofov (Biktarvy )   Patient requested Delivery   Delivery date: 07/01/24   Verified address: [address]   [place] [address]   Medication will be filled on 06/30/24.  Spoke to congretional nurse and patient.

## 2024-06-30 NOTE — Congregational Nurse Program (Signed)
  Dept: [phone]   Congregational Nurse Program Note  Date of Encounter: 06/30/2024  Past Medical History: Past Medical History:  Diagnosis Date   HIV (human immunodeficiency virus infection) (HCC)    HIV (human immunodeficiency virus infection) (HCC) 02/26/2022    Encounter Details:  Community Questionnaire - 06/30/24 0918       Questionnaire   Ask client: Do you give verbal consent for me to treat you today? Yes    Student Assistance N/A    Location Patient Served  [facility]    Encounter Setting Phone/Text/Email    Population Status Migrant/Refugee    Insurance Medicaid    Insurance/Financial Assistance Referral Charitable Care    Medication Have Medication Insecurities;Patient Medications Reviewed;Provided Medication Assistance    Medical Provider Yes    Screening Referrals Made N/A    Medical Referrals Made [facility]    Medical Appointment Completed [facility] P[facility]    CNP Interventions Advocate/Support;Navigate Healthcare System;Case Management;Counsel;Educate    Screenings CN Performed N/A    ED Visit Averted Yes    Life-Saving Intervention Made N/A         I received a call 06/29/24 from [facility] for medication refill. Interpretation services provided via a 3 way call to pharmacy staff and patient. Medication refill Completed.Patient reminded of upcoming appointments.  Naomie Noa Constante RN BSN PCCN  [facility] [phone]-cell [phone]-office

## 2024-07-14 ENCOUNTER — Other Ambulatory Visit (HOSPITAL_COMMUNITY): Payer: Self-pay

## 2024-07-14 ENCOUNTER — Encounter: Payer: Self-pay | Admitting: Nurse Practitioner

## 2024-07-14 ENCOUNTER — Ambulatory Visit (INDEPENDENT_AMBULATORY_CARE_PROVIDER_SITE_OTHER): Admitting: Nurse Practitioner

## 2024-07-14 VITALS — BP 145/101 | HR 109 | Wt 162.0 lb

## 2024-07-14 DIAGNOSIS — Z3A21 21 weeks gestation of pregnancy: Secondary | ICD-10-CM

## 2024-07-14 DIAGNOSIS — I10 Essential (primary) hypertension: Secondary | ICD-10-CM

## 2024-07-14 DIAGNOSIS — O0990 Supervision of high risk pregnancy, unspecified, unspecified trimester: Secondary | ICD-10-CM

## 2024-07-14 DIAGNOSIS — Z758 Other problems related to medical facilities and other health care: Secondary | ICD-10-CM

## 2024-07-14 DIAGNOSIS — O99012 Anemia complicating pregnancy, second trimester: Secondary | ICD-10-CM

## 2024-07-14 DIAGNOSIS — B20 Human immunodeficiency virus [HIV] disease: Secondary | ICD-10-CM

## 2024-07-14 DIAGNOSIS — D573 Sickle-cell trait: Secondary | ICD-10-CM

## 2024-07-14 DIAGNOSIS — O219 Vomiting of pregnancy, unspecified: Secondary | ICD-10-CM

## 2024-07-14 DIAGNOSIS — O0992 Supervision of high risk pregnancy, unspecified, second trimester: Secondary | ICD-10-CM

## 2024-07-14 DIAGNOSIS — Z603 Acculturation difficulty: Secondary | ICD-10-CM

## 2024-07-14 MED ORDER — NIFEDIPINE ER OSMOTIC RELEASE 30 MG PO TB24
30.0000 mg | ORAL_TABLET | Freq: Two times a day (BID) | ORAL | 1 refills | Status: DC
  Filled 2024-07-14: qty 60, 30d supply, fill #0

## 2024-07-14 MED ORDER — SCOPOLAMINE 1 MG/3DAYS TD PT72
1.0000 | MEDICATED_PATCH | TRANSDERMAL | 0 refills | Status: DC
  Filled 2024-07-14: qty 10, 30d supply, fill #0

## 2024-07-14 MED ORDER — GLYCOPYRROLATE 2 MG PO TABS
2.0000 mg | ORAL_TABLET | Freq: Three times a day (TID) | ORAL | 3 refills | Status: DC | PRN
  Filled 2024-07-14: qty 30, 10d supply, fill #0

## 2024-07-14 MED ORDER — ONDANSETRON HCL 4 MG PO TABS
8.0000 mg | ORAL_TABLET | Freq: Two times a day (BID) | ORAL | 0 refills | Status: DC
  Filled 2024-07-14: qty 20, 5d supply, fill #0

## 2024-07-14 NOTE — Progress Notes (Signed)
 History:   Caitlin Marks is a 33 y.o. G3P2002 at [redacted]w[redacted]d by LMP being seen today for her first obstetrical visit.  Her obstetrical history is significant for obesity and essential hypertension, HIV Positive, N/V in pregnacy and language barrier . Patient unsure  intend to breast feed. Pregnancy history fully reviewed. Patient has established care with ID and follows with them.  Patient reports nausea and excessive saliva .      HISTORY: OB History  Gravida Para Term Preterm AB Living  3 2 2  0 0 2  SAB IAB Ectopic Multiple Live Births  0 0 0 0 2    # Outcome Date GA Lbr Len/2nd Weight Sex Type Anes PTL Lv  3 Current           2 Term 12/23/16 [redacted]w[redacted]d   M Vag-Spont None  LIV  1 Term 12/04/08 [redacted]w[redacted]d   F Vag-Spont None  LIV    Last pap smear ( No records of a pap being performed and patient declined today)  Past Medical History:  Diagnosis Date   HIV (human immunodeficiency virus infection) (HCC)    HIV (human immunodeficiency virus infection) (HCC) 02/26/2022   Past Surgical History:  Procedure Laterality Date   NO PAST SURGERIES     Family History  Problem Relation Age of Onset   Other Mother    Diabetes Neg Hx    Cancer Neg Hx    Social History   Tobacco Use   Smoking status: Never    Passive exposure: Past   Smokeless tobacco: Never  Vaping Use   Vaping status: Never Used  Substance Use Topics   Alcohol use: Not Currently    Comment: reports 2-3 bottles beer on some weekends   Drug use: Never   No Known Allergies Current Outpatient Medications on File Prior to Visit  Medication Sig Dispense Refill   bictegravir-emtricitabine -tenofovir  AF (BIKTARVY ) 50-200-25 MG TABS tablet Take 1 tablet by mouth daily. Try to take at the same time each day with or without food. 30 tablet 11   Prenatal 27-1 MG TABS Take 1 tablet by mouth daily. 60 tablet 3   No current facility-administered medications on file prior to visit.    Review of Systems Pertinent items noted in  HPI and remainder of comprehensive ROS otherwise negative. Physical Exam:   Vitals:   07/14/24 1023 07/14/24 1047  BP: (!) 144/95 (!) 145/101  Pulse: (!) 111 (!) 109  Weight: 162 lb (73.5 kg)    Fetal Heart Rate (bpm): 158 FH 22  Constitutional: Well-developed, obese  pregnant female in no acute distress.  HEENT: PERRLA Skin: normal color and turgor, no rash Cardiovascular: normal rate & rhythm, hypertensive Respiratory: normal effort, no problems with respiration noted GI: Abd soft, non-distended MS: Extremities nontender, no edema, normal ROM Neurologic: Alert and oriented x 4.  GU: no CVA tenderness Pelvic: Exam deferred per patient request   Assessment:    Pregnancy: [id number] Patient Active Problem List   Diagnosis Date Noted   Supervision of high risk pregnancy, antepartum 06/07/2024   Primary hypertension 04/15/2023   HIV (human immunodeficiency virus infection) (HCC) 02/26/2022     Plan:     1. Currently asymptomatic HIV infection, with history of HIV-related illness (HCC) - On Biktarvy   -  Viral load on 06/03/24 undetectable RNA at 50 copies  -  Follows with ID , Viral loads q 3 months  2. Primary hypertension - Procardia  30 XL increased to BID dosing due  to MRBP's today  - Patient asymptomatic ( Denies HA, visual changes, RUQ pain, SOB or CP)  3. Supervision of high risk pregnancy, antepartum (Primary) - Pap declined (Will need at [facility] Visit) - No Pap smears found in EMAR - Offer PP MMR  4. [redacted] weeks gestation of pregnancy - FHR appropriate - FH appropriate - Anatomy ultrasound scheduled for 08/08/24  5. Nausea and vomiting in pregnancy -- Patient c/o N/V with excessive saliva  - glycopyrrolate  (ROBINUL ) 2 MG tablet; Take 1 tablet (2 mg total) by mouth 3 (three) times daily as needed.  Dispense: 30 tablet; Refill: 3 - scopolamine  (TRANSDERM-SCOP) 1 MG/3DAYS; Place 1 patch (1 mg total) onto the skin every 3 (three) days.  Dispense: 10 patch; Refill: 0 -  ondansetron  (ZOFRAN ) 4 MG tablet; Take 2 tablets (8 mg total) by mouth 2 (two) times daily.  Dispense: 20 tablet; Refill: 0  6. Language barrier - Swahili in person interpreter present during the entire encounter    - Continue prenatal vitamins. - Problem list reviewed and updated. - Genetic Screening discussed, First trimester screen, Quad screen, and NIPS: requested. - Ultrasound discussed; fetal anatomic survey: requested. - Anticipatory guidance about prenatal visits given including labs, ultrasounds, and testing. - Discussed usage of Babyscripts and virtual visits as additional source of managing and completing prenatal visits in midst of coronavirus and pandemic.   - Encouraged to complete MyChart Registration for her ability to review results, send requests, and have questions addressed.  - The nature of [facility] with multiple MDs and Advanced Practice Providers was explained to patient; also emphasized that residents, students are part of our team. - Routine obstetric precautions reviewed. Encouraged to seek out care at office or emergency room [facility] preferred) for urgent and/or emergent concerns.  Return in about 4 weeks (around 08/11/2024) for [facility].    Future Appointments  Date Time Provider Department Center  08/08/2024  8:00 AM [facility] PROVIDER 1 [facility]  08/08/2024  8:30 AM [facility] US3 [facility]  08/11/2024  1:15 PM Cleatus Moccasin, MD [facility]  08/29/2024  3:00 PM Melvenia Corean SAILOR, NP [facility]  12/12/2024  2:10 PM [name], MD [facility]

## 2024-07-14 NOTE — Patient Instructions (Signed)
 Commonly Asked Questions During Pregnancy  How Will I Feel When I'm Pregnant? Pregnancy symptoms in the first trimester of pregnancy may not appear until the middle or end of the second month. Hormonal changes will cause tenderness in your breasts, and you may begin to feel more tired than usual. Food cravings, an increase in the need to urinate, and morning sickness may all be more noticeable.  Pregnancy symptoms in the second trimester are more prominent. You may start to feel the baby move and become more active. Dental issues, nasal/sinus problems, and skin irritations can begin to appear. Heartburn, leg cramps, dizziness, and a vaginal discharge are also common. Every woman is different when it comes to the symptoms they experience, and some may not experience any at all. Pregnancy symptoms in the third trimester can include increased frequency in urination, leg cramps, constipation, ligament pain in the abdomen, and weight gain. Back pain and Braxton Hicks contractions will become increasingly more common.  Why is nutrition during pregnancy important? Eating well is one of the best things you can do during pregnancy. Good nutrition helps you handle the extra demands on your body as your pregnancy progresses. The goal is to balance getting enough nutrients to support the growth of your fetus and maintaining a healthy weight.  How much water should I drink during pregnancy? During pregnancy you should drink 8 to 12 cups (64 to 96 ounces) of water every day. Water has many benefits. It aids digestion and helps form the amniotic fluid around the fetus. Water also helps nutrients circulate in the body and helps waste leave the body.  What can I do to help with nausea? Eat dry toast or crackers in the morning before you get out of bed to avoid moving around on an empty stomach. Eat five or six "mini meals" a day to ensure that your stomach is never empty. Eat frequent bites of foods like nuts,  fruits, or crackers.  What can help with constipation during pregnancy? Constipation is common near the end of pregnancy. Eating more foods with fiber can help fight constipation. Fiber is found in fruits, vegetables, whole grains, beans, nuts, and seeds. You should aim for about 25 grams of fiber in your diet each day. Drink a lot of water as you increase your fiber intake.  How much coffee can I drink while I'm pregnant? Research suggests that moderate caffeine consumption (less than 200 milligrams per day) does not cause miscarriage or preterm birth. That's the amount in one 12-ounce cup of coffee. Remember that caffeine also is found in tea, chocolate, energy drinks, and soft drinks. Caffeine can interfere with sleep and contribute to nausea and light-headedness. Caffeine also can increase urination and lead to dehydration.  What can I do to prevent or ease back pain during pregnancy? There are several things you can do to prevent or ease back pain. For example, wear supportive clothing and shoes. Pay attention to your position when sitting, sleeping, and lifting things. If you need to stand for a long time, rest one foot on a stool or a box to take the strain off your back. You also can use heat or cold to soothe sore muscles.  Is it safe to exercise during pregnancy? If you are healthy and your pregnancy is normal, it is safe to continue or start regular physical activity. Physical activity does not increase your risk of miscarriage, low birth weight, or early delivery. It's still important to discuss exercise with your ob-gyn  provider during your early prenatal visits.   What are the benefits of exercise during pregnancy? Regular exercise during pregnancy benefits you and your fetus in these key ways: Reduces back pain Eases constipation May decrease your risk of gestational diabetes, preeclampsia, and cesarean birth Promotes healthy weight gain during pregnancy Improves your overall  fitness and strengthens your heart and blood vessels Helps you to lose the baby weight after your baby is born  Is it safe to dye my hair during pregnancy? Yes, it's safe. Only a small amount of chemicals from hair dye is absorbed through the scalp.  Is it safe to keep a cat during pregnancy? Yes, you can keep your cat. You may have heard that cat feces can carry the infection toxoplasmosis. This infection is only found in cats who go outdoors and hunt prey, such as mice and other rodents. If you do have a cat who goes outdoors or eats prey, have someone else take over daily cleaning the litter box. This will keep you away from any cat feces. If you have an indoor cat who only eats cat food and doesn't have contact with outside animals, your risk of toxoplasmosis is very low.  What substances should I avoid during pregnancy? During pregnancy, women should not use tobacco, alcohol, marijuana, illegal drugs, or prescription medications for nonmedical reasons. Avoiding these substances and getting regular prenatal care are important to having a healthy pregnancy and a healthy baby.   What foods to I need to avoid in pregnancy? To help prevent listeriosis, avoid eating the following foods while you are pregnant: Unpasteurized milk and foods made with unpasteurized milk, including soft cheeses Hot dogs and luncheon meats, unless they are heated until steaming hot just before serving Unwashed raw produce such as fruits and vegetables  Avoid all raw and undercooked seafood, eggs, meat, and poultry while you are pregnant. Do not eat sushi made with raw fish (cooked sushi is safe). Cooking and pasteurization are the only ways to kill Listeria.  Limit your exposure to mercury by not eating bigeye tuna, king mackerel, marlin, orange roughy, shark, swordfish, or tilefish. Limit eating white (albacore) tuna to 6 ounces a week. You do not have to avoid all fish during pregnancy. In fact, fish and shellfish are  nutritious foods with vital nutrients for a pregnant woman and her fetus. Be sure to eat at least 8-12 ounces of low-mercury fish and shellfish per week.  Is travel safe to during pregnancy? In most cases, pregnant women can travel safely until close to their due dates. But travel may not be recommended for women who have pregnancy complications. If you are planning a trip, talk with your (ob-gyn) provider. And no matter how you choose to travel, think ahead about your comfort and safety.  Can I use a sauna or hot tub early in pregnancy? It's best not to. Your core body temperature rises when you use saunas and hot tubs. This rise in temperature can be harmful for your fetus.  Can I get a massage while pregnant? Yes. Massage is a good way to relax and improve circulation. The best position for a massage while you're pregnant is lying on your side, rather than facedown. Some massage tables have a cut-out for the belly, allowing you to lie facedown comfortably. Tell your massage therapist that you're pregnant if you're not showing yet. Many health spas offer special prenatal massages done by therapists who are trained to work on pregnant women.  Is Having Dental  Work While Pregnant Safe? Pregnancy and dental work questions are common for expecting moms. Preventive dental cleanings and annual exams during pregnancy are not only safe but are recommended. The rise in hormone levels during pregnancy causes the gums to swell, bleed, and trap food causing increased irritation to your gums. Preventive dental work while pregnant is essential to avoid oral infections such as gum disease, which has been linked to preterm birth. The American Dental Association (ADA) recommends pregnant women eat a balanced diet, brush their teeth thoroughly with ADA-approved fluoride toothpaste twice a day, and floss daily. Have preventive exams and cleanings during your pregnancy. Let your dentist know you are  pregnant. Postpone non-emergency dental work until the second trimester or after delivery, if possible. Elective procedures should be postponed until after the delivery.  Safe Medications in Pregnancy   Acne:  Benzoyl Peroxide  Salicylic Acid   Backache/Headache:  Tylenol : 2 regular strength every 4 hours OR               2 Extra strength every 6 hours   Colds/Coughs/Allergies:  Benadryl (alcohol free) 25 mg every 6 hours as needed  Breath right strips  Claritin  Cepacol throat lozenges  Chloraseptic throat spray  Cold-Eeze- up to three times per day  Cough drops, alcohol free  Flonase (by prescription only)  Guaifenesin  Mucinex  Robitussin DM (plain only, alcohol free)  Saline nasal spray/drops  Sudafed (pseudoephedrine) & Actifed * use only after [redacted] weeks gestation and if you do not have high blood pressure  Tylenol   Vicks Vaporub  Zinc lozenges  Zyrtec   Constipation:  Colace  Ducolax suppositories  Fleet enema  Glycerin suppositories  Metamucil  Milk of magnesia  Miralax   Senokot  Smooth move tea   Diarrhea:  Kaopectate  Imodium A-D   *NO pepto Bismol   Hemorrhoids:  Anusol  Anusol HC  Preparation H  Tucks   Indigestion:  Tums  Maalox  Mylanta  Zantac  Pepcid   Insomnia:  Benadryl (alcohol free) 25mg  every 6 hours as needed  Tylenol  PM  Unisom, no Gelcaps   Leg Cramps:  Tums  MagGel   Nausea/Vomiting:  Bonine  Dramamine  Emetrol  Ginger extract  Sea bands  Meclizine  Nausea medication to take during pregnancy:  Unisom (doxylamine succinate 25 mg tablets) Take one tablet daily at bedtime. If symptoms are not adequately controlled, the dose can be increased to a maximum recommended dose of two tablets daily (1/2 tablet in the morning, 1/2 tablet mid-afternoon and one at bedtime).  Vitamin B6 100mg  tablets. Take one tablet twice a day (up to 200 mg per day).   Skin Rashes:  Aveeno products  Benadryl cream or 25mg  every 6 hours as  needed  Calamine Lotion  1% cortisone cream   Yeast infection:  Gyne-lotrimin 7  Monistat 7    **If taking multiple medications, please check labels to avoid duplicating the same active ingredients  **take medication as directed on the label  ** Do not exceed 4000 mg of tylenol  in 24 hours  **Do not take medications that contain aspirin or ibuprofen             We highly recommend childbirth education to help you plan for labor and begin practicing coping skills (which will be needed with or without pain meds).  [place] Childbirth Education Options: Sign up by visiting [email or web address]  Childbirth ~ Self-Paced eClass (English and Spanish) This online class offers you the freedom  to complete a childbirth education series in the comfort of your own home at your own pace.  Childbirth Class (In-Person 4-Week Series  or on Saturdays, Virtual 4-Week Series ~ [place]) This interactive in-person class series will help you and your partner prepare for your birth experience. Topics include: Labor & Birth, Comfort Measures, Breathing Techniques, Massage, Medical Interventions, Pain Management Options, Cesarean Birth, Postpartum Care, and Newborn Care  Comfort Techniques for Labor ~ In-Person Class [facility]) This interactive class is designed for parents-to-be who want to learn & practice hands-on skills to help relieve some of the discomfort of labor and encourage their babies to rotate toward the best position for birth. Moms and their partners will be able to try a variety of labor positions with birth balls and rebozos as well as practice breathing, relaxation, and visualization techniques.  Natural Childbirth Class (In-Person 5-Week Series, In-Person on Saturdays or Virtual 5-Week Series ~ [place]) This class series is designed for expectant parents who want to learn and practice natural methods of coping with the process of labor and childbirth.  Cesarean Birth Self-Paced  eClass (English and Spanish) This online course provides comprehensive information you can trust as you prepare for a possible cesarean birth. In this class, you'll learn how to make your birth and recovery comfortable and joyful through instructive video clips, animations, and activities.  Waterbirth ~ Airline pilot Interested in a waterbirth? In addition to a consultation with your credentialed waterbirth provider, this free, informational online class will help you discover whether waterbirth is the right fit for you. Not all obstetrical practices offer waterbirth, so check with your healthcare provider.  Tour Probation officer) - [facility] [employer] our 4 minute video tour of [facility] located in [place].   [place] Parenting Education Options:  Pregnancy 101 (Virtual) Congratulations on your pregnancy! This class is geared toward moms in their first trimester, but everyone is welcome. We are excited to guide you through all aspects of supporting a healthy pregnancy. You will learn what to expect at routine prenatal care appointments, common postpartum adjustments, basic infant safety, and breastfeeding.  Successful Partnering & Parenting ~ In-Person Workshop [facility]) This workshop inspires and equips partners of all economic levels, ages, and cultures to confidently care for their infants, support the birthing persons, and navigate their own transformations into new partners and parents. Learning activities are geared towards supporting partner, but moms are welcome to attend.  'Baby & Me' Parenting Group (Virtual on Wednesdays at 11am) Enjoy this time discussing newborn & infant parenting topics and family adjustment issues with other new parents in a relaxed environment. Each week brings a new speaker or baby-centered activity. This group offers support and connection to parents as they journey through the adjustments and  struggles of that sometimes overwhelming first year after the birth of a child.  Baby Safety, CPR, & Choking Class ~ Virtual This life-saving information is meant to encourage parents as they learn important safety and prevention tips as well as infant CPR and relief of choking.  Breastfeeding Class (In-Person in [place] or [employer]) Families learn what to expect in the first days and weeks of breastfeeding your newborn. IF YOU ARE AN EMPLOYEE TAKING THIS CLASS FOR CREDIT, DO NOT register yourself. Please e-mail [email or web address].   Breastfeeding Self-Paced eClass (English & Spanish) Families learn what to expect in the first days and weeks of breastfeeding your newborn.  Caring for Baby ~ In-Person, Virtual or Self-Paced Class  This in-person class is for both expectant and adoptive parents who want to learn and practice the most up-to-date newborn care for their babies. Focus is on birth through the first six weeks of life.  CPR & Choking Relief for Infants & Children ~ In-Person Class [facility]) This in-person course is designed for any parent, expectant parent, or adult who cares for infants or children. Participants learn and demonstrate cardiopulmonary resuscitation and choking relief procedures for both infants and children.  Grandparent Love ~ In-Person Class Grandparents will learn the most updated infant care and safety recommendations. They will discover ways to support their own children during the transition into the parenting role and receive tips on communicating with the new parents.  [place] Parenting Support Group Options:  Bereavement Grief Support Group (Pregnancy/Infant Loss) - Virtual This is an ongoing experience that meets once a month and is designed to help you honor the past, assist you in discovering tools to strengthen you today, and aid you in developing hope for the future.  Breastfeeding & Pumping Support Group (In-Person on Thursdays at 12pm or  Virtual on Tuesdays at 5pm) Join us  in-person each Thursday starting June 1st, 2023 at 12pm! This support group is free for all families looking for breastfeeding and/or pumping support.   Community-Based Childbirth Education Options:  [facility] Department Classes:  Childbirth education classes can help you get ready for a positive parenting experience. You can also meet other expectant parents and get free stuff for your baby. Each class runs for five weeks on the same night and costs $45 for the mother-to-be and her support person. Medicaid covers the cost if you are eligible. Call [phone] to register.  [employer] offers a variety of programs for the [employer] and is another great way to get connected. Please go to [email or web address] for more information.  Childbirth With A Twist! Be informed of your options, get educated on birth, understand what your body is doing, learn how to cope, and have a lot of fun and laughs all while doing it either from the comfort of your couch OR in our cozy office and classroom space near the [place] airport. If you are taking a virtual class, then class is taught LIVE, so you can ask questions and receive answers in real-time from an experienced doula and childbirth educator.  This virtual childbirth education class will meet for five instruction times online.  Although we are based in [place], [place], this virtual class is open to anyone in the world. Please visit: [email or web address] for more information.  Books We Love: The Doula Guide to Childbirth by Retia Cook and Vernell Donald The First-Time Parent's Childbirth Handbook by Dr. Corean Glatter, CNM The Birth Partner by [employer]

## 2024-07-22 ENCOUNTER — Other Ambulatory Visit: Payer: Self-pay

## 2024-07-26 ENCOUNTER — Other Ambulatory Visit: Payer: Self-pay

## 2024-07-26 NOTE — Progress Notes (Signed)
 Specialty Pharmacy Refill Coordination Note  Caitlin Marks is a 33 y.o. female contacted today regarding refills of specialty medication(s) Bictegravir-Emtricitab-Tenofov (Biktarvy )   Patient requested Delivery   Delivery date: 08/01/24   Verified address: [address]  [place] [address]   Medication will be filled on 09.19.25.

## 2024-07-28 ENCOUNTER — Other Ambulatory Visit: Payer: Self-pay

## 2024-08-04 ENCOUNTER — Inpatient Hospital Stay (HOSPITAL_COMMUNITY)

## 2024-08-04 ENCOUNTER — Inpatient Hospital Stay (HOSPITAL_COMMUNITY)
Admission: AD | Admit: 2024-08-04 | Discharge: 2024-08-04 | Disposition: A | Discharge status: Home / Self Care | Attending: Obstetrics & Gynecology | Admitting: Obstetrics & Gynecology

## 2024-08-04 ENCOUNTER — Encounter (HOSPITAL_COMMUNITY): Payer: Self-pay | Admitting: Obstetrics and Gynecology

## 2024-08-04 DIAGNOSIS — O10012 Pre-existing essential hypertension complicating pregnancy, second trimester: Secondary | ICD-10-CM

## 2024-08-04 DIAGNOSIS — E669 Obesity, unspecified: Secondary | ICD-10-CM | POA: Diagnosis not present

## 2024-08-04 DIAGNOSIS — Z3687 Encounter for antenatal screening for uncertain dates: Secondary | ICD-10-CM | POA: Diagnosis not present

## 2024-08-04 DIAGNOSIS — R109 Unspecified abdominal pain: Secondary | ICD-10-CM | POA: Diagnosis not present

## 2024-08-04 DIAGNOSIS — Z363 Encounter for antenatal screening for malformations: Secondary | ICD-10-CM

## 2024-08-04 DIAGNOSIS — O4692 Antepartum hemorrhage, unspecified, second trimester: Secondary | ICD-10-CM | POA: Diagnosis not present

## 2024-08-04 DIAGNOSIS — B9689 Other specified bacterial agents as the cause of diseases classified elsewhere: Secondary | ICD-10-CM | POA: Diagnosis not present

## 2024-08-04 DIAGNOSIS — Z3A22 22 weeks gestation of pregnancy: Secondary | ICD-10-CM | POA: Diagnosis not present

## 2024-08-04 DIAGNOSIS — Z3A24 24 weeks gestation of pregnancy: Secondary | ICD-10-CM | POA: Diagnosis not present

## 2024-08-04 DIAGNOSIS — O99212 Obesity complicating pregnancy, second trimester: Secondary | ICD-10-CM | POA: Insufficient documentation

## 2024-08-04 DIAGNOSIS — O98712 Human immunodeficiency virus [HIV] disease complicating pregnancy, second trimester: Secondary | ICD-10-CM | POA: Diagnosis not present

## 2024-08-04 DIAGNOSIS — B009 Herpesviral infection, unspecified: Secondary | ICD-10-CM

## 2024-08-04 DIAGNOSIS — O4702 False labor before 37 completed weeks of gestation, second trimester: Secondary | ICD-10-CM | POA: Diagnosis not present

## 2024-08-04 DIAGNOSIS — O26892 Other specified pregnancy related conditions, second trimester: Secondary | ICD-10-CM | POA: Diagnosis not present

## 2024-08-04 DIAGNOSIS — Z0371 Encounter for suspected problem with amniotic cavity and membrane ruled out: Secondary | ICD-10-CM | POA: Diagnosis present

## 2024-08-04 DIAGNOSIS — O23592 Infection of other part of genital tract in pregnancy, second trimester: Secondary | ICD-10-CM | POA: Insufficient documentation

## 2024-08-04 DIAGNOSIS — Z21 Asymptomatic human immunodeficiency virus [HIV] infection status: Secondary | ICD-10-CM | POA: Diagnosis not present

## 2024-08-04 DIAGNOSIS — Z113 Encounter for screening for infections with a predominantly sexual mode of transmission: Secondary | ICD-10-CM | POA: Diagnosis present

## 2024-08-04 LAB — TYPE AND SCREEN
ABO/RH(D): B POS
Antibody Screen: NEGATIVE

## 2024-08-04 LAB — POCT FERN TEST
POCT Fern Test: POSITIVE
POCT Fern Test: NEGATIVE
POCT Fern Test: POSITIVE

## 2024-08-04 LAB — URINALYSIS, ROUTINE W REFLEX MICROSCOPIC
Bilirubin Urine: NEGATIVE
Glucose, UA: NEGATIVE mg/dL
Hgb urine dipstick: NEGATIVE
Ketones, ur: NEGATIVE mg/dL
Leukocytes,Ua: NEGATIVE
Nitrite: NEGATIVE
Protein, ur: NEGATIVE mg/dL
Specific Gravity, Urine: 1.008 (ref 1.005–1.030)
pH: 6 (ref 5.0–8.0)

## 2024-08-04 LAB — CBC
HCT: 30.4 % — ABNORMAL LOW (ref 36.0–46.0)
Hemoglobin: 10.3 g/dL — ABNORMAL LOW (ref 12.0–15.0)
MCH: 28.5 pg (ref 26.0–34.0)
MCHC: 33.9 g/dL (ref 30.0–36.0)
MCV: 84.2 fL (ref 80.0–100.0)
Platelets: 188 K/uL (ref 150–400)
RBC: 3.61 MIL/uL — ABNORMAL LOW (ref 3.87–5.11)
RDW: 14.1 % (ref 11.5–15.5)
WBC: 5.9 K/uL (ref 4.0–10.5)
nRBC: 0 % (ref 0.0–0.2)

## 2024-08-04 LAB — COMPREHENSIVE METABOLIC PANEL WITH GFR
ALT: 15 U/L (ref 0–44)
AST: 28 U/L (ref 15–41)
Albumin: 2.9 g/dL — ABNORMAL LOW (ref 3.5–5.0)
Alkaline Phosphatase: 67 U/L (ref 38–126)
Anion gap: 14 (ref 5–15)
BUN: <5 mg/dL — ABNORMAL LOW (ref 6–20)
CO2: 15 mmol/L — ABNORMAL LOW (ref 22–32)
Calcium: 8.7 mg/dL — ABNORMAL LOW (ref 8.9–10.3)
Chloride: 110 mmol/L (ref 98–111)
Creatinine, Ser: 0.73 mg/dL (ref 0.44–1.00)
GFR, Estimated: >60 mL/min (ref >60)
Glucose, Bld: 77 mg/dL (ref 70–99)
Potassium: 3.8 mmol/L (ref 3.5–5.1)
Sodium: 139 mmol/L (ref 135–145)
Total Bilirubin: 0.5 mg/dL (ref 0.0–1.2)
Total Protein: 6.8 g/dL (ref 6.5–8.1)

## 2024-08-04 LAB — WET PREP, GENITAL
Sperm: NONE SEEN
Trich, Wet Prep: NONE SEEN
WBC, Wet Prep HPF POC: <10 (ref <10)
Yeast Wet Prep HPF POC: NONE SEEN

## 2024-08-04 LAB — RUPTURE OF MEMBRANE (ROM)PLUS: Rom Plus: NEGATIVE

## 2024-08-04 MED ORDER — TERBUTALINE SULFATE 1 MG/ML IJ SOLN: 0.2500 mg | Freq: Once | INTRAMUSCULAR | Status: DC

## 2024-08-04 MED ORDER — METRONIDAZOLE 500 MG PO TABS
500.0000 mg | ORAL_TABLET | Freq: Two times a day (BID) | ORAL | 0 refills | Status: DC
  Filled 2024-08-04: qty 14, 7d supply, fill #0

## 2024-08-04 MED ORDER — LACTATED RINGERS IV BOLUS
1000.0000 mL | Freq: Once | INTRAVENOUS | Status: AC
  Administered 2024-08-04: 1000 mL via INTRAVENOUS

## 2024-08-04 NOTE — MAU Note (Signed)
 Caitlin Marks is a 33 y.o. at [redacted]w[redacted]d here in MAU reporting: overhead call to [facility], where pt had arrived. Brought to MAU byOB RR, Dr JAYNE , NICU and L&D charge.  3rd baby, prior term vag delivery. Initially vertex on US .  Having trouble finding FH on monitor, repeat sono- baby is now transverse and moving. Abd pain started at noon.  Had one episode of bleeding when she went to the restroom around 1300.  Onset of complaint: noon Pain score: ?8 per face scale.  - pt states this doesn't feel like labor Vitals:   08/04/24 1855  BP: (!) 145/95  Pulse: (!) 123  Resp: 20  Temp: 98.4 F (36.9 C)     FHT:161 Lab orders placed from triage:

## 2024-08-04 NOTE — Discharge Instructions (Signed)
 Reasons to return to MAU at [facility]:  Since you are preterm, return to MAU if:  1.  Contractions are 10 minutes apart or less and they becoming more uncomfortable or painful over time 2.  You have a large gush of fluid, or a trickle of fluid that will not stop and you have to wear a pad 3.  You have bleeding that is bright red, heavier than spotting--like menstrual bleeding (spotting can be normal in early labor or after a check of your cervix) 4.  You do not feel the baby moving like he/she normally does

## 2024-08-04 NOTE — MAU Provider Note (Cosign Needed Addendum)
 History     CSN: [id number]  Arrival date and time: 08/04/24 8147   Event Date/Time   First Provider Initiated Contact with Patient 08/04/24 1858      Chief Complaint  Patient presents with   Abdominal Pain   Vaginal Bleeding   HPI  Caitlin Marks is a 33 y.o. G3P2002 at [redacted]w[redacted]d who presents for evaluation of abdominal pain. She presented to the [facility] [place] crying in pain and imminent delivery was called. Dr. Jayne met patient at entrance and escorted to MAU for further evaluation.   Patient reports at 1300 she had spotting when she wiped after using the bathroom and one episode of pain. Since then, she has occasional pain in her abdomen. Patient rates the pain as a 8/10 and has not tried anything for the pain.   She denies any discharge and leaking of fluid. Denies any constipation, diarrhea or any urinary complaints. Reports normal fetal movement.   OB History     Gravida  3   Para  2   Term  2   Preterm  0   AB  0   Living  2      SAB  0   IAB  0   Ectopic  0   Multiple      Live Births  2           Past Medical History:  Diagnosis Date   HIV (human immunodeficiency virus infection) (HCC)    HIV (human immunodeficiency virus infection) (HCC) 02/26/2022    Past Surgical History:  Procedure Laterality Date   NO PAST SURGERIES      Family History  Problem Relation Age of Onset   Other Mother    Diabetes Neg Hx    Cancer Neg Hx     Social History   Tobacco Use   Smoking status: Never    Passive exposure: Past   Smokeless tobacco: Never  Vaping Use   Vaping status: Never Used  Substance Use Topics   Alcohol use: Not Currently    Comment: reports 2-3 bottles beer on some weekends   Drug use: Never    Allergies: No Known Allergies  Medications Prior to Admission  Medication Sig Dispense Refill Last Dose/Taking   NIFEdipine  (PROCARDIA  XL) 30 MG 24 hr tablet Take 1 tablet (30 mg total) by mouth 2 (two) times daily.  60 tablet 1 08/04/2024   ondansetron  (ZOFRAN ) 4 MG tablet Take 2 tablets (8 mg total) by mouth 2 (two) times daily. 20 tablet 0 08/04/2024   bictegravir-emtricitabine -tenofovir  AF (BIKTARVY ) 50-200-25 MG TABS tablet Take 1 tablet by mouth daily. Try to take at the same time each day with or without food. 30 tablet 11    glycopyrrolate  (ROBINUL ) 2 MG tablet Take 1 tablet (2 mg total) by mouth 3 (three) times daily as needed. 30 tablet 3    Prenatal 27-1 MG TABS Take 1 tablet by mouth daily. 60 tablet 3    scopolamine  (TRANSDERM-SCOP) 1 MG/3DAYS Place 1 patch (1 mg total) onto the skin every 3 (three) days. 10 patch 0     Review of Systems  Constitutional: Negative.  Negative for fatigue and fever.  HENT: Negative.    Respiratory: Negative.  Negative for shortness of breath.   Cardiovascular: Negative.  Negative for chest pain.  Gastrointestinal: Negative.  Negative for abdominal pain, constipation, diarrhea, nausea and vomiting.  Genitourinary: Negative.  Negative for dysuria.  Neurological: Negative.  Negative for dizziness  and headaches.   Physical Exam   Blood pressure 118/82, pulse (!) 113, temperature 98.4 F (36.9 C), temperature source Oral, resp. rate 20, last menstrual period 02/12/2024, SpO2 99%.  Patient Vitals for the past 24 hrs:  BP Temp Temp src Pulse Resp SpO2  08/04/24 2030 118/82 -- -- (!) 113 -- 99 %  08/04/24 1930 128/87 -- -- (!) 115 -- 98 %  08/04/24 1915 127/77 -- -- (!) 119 -- 98 %  08/04/24 1855 (!) 145/95 98.4 F (36.9 C) Oral (!) 123 20 --    Physical Exam Vitals and nursing note reviewed.  Constitutional:      General: She is not in acute distress.    Appearance: She is well-developed.  HENT:     Head: Normocephalic.  Eyes:     Pupils: Pupils are equal, round, and reactive to light.  Cardiovascular:     Rate and Rhythm: Normal rate and regular rhythm.     Heart sounds: Normal heart sounds.  Pulmonary:     Effort: Pulmonary effort is normal. No  respiratory distress.     Breath sounds: Normal breath sounds.  Abdominal:     General: Bowel sounds are normal. There is no distension.     Palpations: Abdomen is soft.     Tenderness: There is no abdominal tenderness.  Skin:    General: Skin is warm and dry.  Neurological:     Mental Status: She is alert and oriented to person, place, and time.  Psychiatric:        Mood and Affect: Mood normal.        Behavior: Behavior normal.        Thought Content: Thought content normal.        Judgment: Judgment normal.     Fetal Tracing:  Baseline: 155 Variability: moderate Accels: none Decels: none   Toco: UI  Dilation: 2 Effacement (%): Thick Presentation: Transverse Exam by:: C Neill CNM   MAU Course  Procedures  Results for orders placed or performed during the hospital encounter of 08/04/24 (from the past 24 hours)  Wet prep, genital     Status: Abnormal   Collection Time: 08/04/24  8:31 PM  Result Value Ref Range   Yeast Wet Prep HPF POC NONE SEEN NONE SEEN   Trich, Wet Prep NONE SEEN NONE SEEN   Clue Cells Wet Prep HPF POC PRESENT (A) NONE SEEN   WBC, Wet Prep HPF POC <10 <10   Sperm NONE SEEN   Fern Test     Status: None   Collection Time: 08/04/24  8:31 PM  Result Value Ref Range   POCT Fern Test Positive = ruptured amniotic membanes                MDM Labs ordered and reviewed.   CNM met patient at bedside upon arrival. Cervix immediately checked and verified FHT with ultrasound. Breech presentation noted but fetus moving frequently to other presenting positions.  FHR reassuring, no contractions noted on toco. 1 palpated for 20 seconds.   CNM consulted with Dr. Jayne regarding presentation and results- MD recommends terbutaline , IV fluids and complete ultrasound for dating.   IV bolus Terb if able with maternal heart rate US  MFM OB Comp  After return from ultrasound, patient reports no abdominal pain but now has vaginal pain she describes as  moderate. Vaginal swabs ordered and discharge appears to be watery. Fern collected  [place] test positive. ROM+ ordered for confirmation.  Dr. Jayne  updated of current assessment and pending results.   Care turned over to L. Leftwich-Kirby CNM at 2100. Aleck CHRISTELLA Fireman CNM 08/04/24   MDM:  ROM plus test was negative.  SSE performed and no pooling of fluid. Slide collected and fern negative.  Cervix 1-2 cm external os but closed internally. No evidence of preterm labor.  Consult Dr Jayne with assessment and findings.  S/sx of BV, and clue cells on wet prep. Will treat for BV.  D/C home with PTL precautions.   Message sent to [facility] office to schedule prenatal visits and assist with transportation via pregnancy navigator.    Urinalysis/Urine culture pending  Assessment and Plan   A/P: 1. Abdominal pain during pregnancy in second trimester   2. [redacted] weeks gestation of pregnancy   3. Threatened preterm labor, second trimester   4. Bacterial vaginosis   5. Encounter for suspected premature rupture of amniotic membranes, with rupture of membranes not found      D/C home with PTL precautions

## 2024-08-05 ENCOUNTER — Other Ambulatory Visit (HOSPITAL_COMMUNITY): Payer: Self-pay

## 2024-08-05 ENCOUNTER — Other Ambulatory Visit: Payer: Self-pay

## 2024-08-05 LAB — GC/CHLAMYDIA PROBE AMP (~~LOC~~) NOT AT ARMC
Chlamydia: NEGATIVE
Comment: NEGATIVE
Comment: NORMAL
Neisseria Gonorrhea: NEGATIVE

## 2024-08-06 LAB — CULTURE, OB URINE: Culture: >=100000 — AB

## 2024-08-07 ENCOUNTER — Other Ambulatory Visit: Payer: Self-pay

## 2024-08-07 ENCOUNTER — Telehealth: Payer: Self-pay | Admitting: Licensed Clinical Social Worker

## 2024-08-07 ENCOUNTER — Telehealth: Payer: Self-pay | Admitting: Obstetrics and Gynecology

## 2024-08-07 ENCOUNTER — Encounter (HOSPITAL_COMMUNITY): Payer: Self-pay | Admitting: Obstetrics and Gynecology

## 2024-08-07 ENCOUNTER — Inpatient Hospital Stay (HOSPITAL_COMMUNITY)
Admission: AD | Admit: 2024-08-07 | Discharge: 2024-08-07 | Disposition: A | Discharge status: Home / Self Care | Payer: Self-pay | Attending: Obstetrics and Gynecology | Admitting: Obstetrics and Gynecology

## 2024-08-07 DIAGNOSIS — N390 Urinary tract infection, site not specified: Secondary | ICD-10-CM | POA: Diagnosis not present

## 2024-08-07 DIAGNOSIS — Z1612 Extended spectrum beta lactamase (ESBL) resistance: Secondary | ICD-10-CM | POA: Insufficient documentation

## 2024-08-07 DIAGNOSIS — Z5982 Transportation insecurity: Secondary | ICD-10-CM | POA: Insufficient documentation

## 2024-08-07 DIAGNOSIS — B9689 Other specified bacterial agents as the cause of diseases classified elsewhere: Secondary | ICD-10-CM

## 2024-08-07 DIAGNOSIS — Z3A25 25 weeks gestation of pregnancy: Secondary | ICD-10-CM | POA: Diagnosis not present

## 2024-08-07 DIAGNOSIS — B961 Klebsiella pneumoniae [K. pneumoniae] as the cause of diseases classified elsewhere: Secondary | ICD-10-CM | POA: Diagnosis not present

## 2024-08-07 DIAGNOSIS — O2342 Unspecified infection of urinary tract in pregnancy, second trimester: Secondary | ICD-10-CM | POA: Diagnosis present

## 2024-08-07 DIAGNOSIS — O26892 Other specified pregnancy related conditions, second trimester: Secondary | ICD-10-CM

## 2024-08-07 MED ORDER — LIDOCAINE HCL 1 % IJ SOLN
5.0000 mL | Freq: Once | INTRAMUSCULAR | Status: DC
  Filled 2024-08-07: qty 5

## 2024-08-07 MED ORDER — ERTAPENEM SODIUM 1 G IJ SOLR
1.0000 g | Freq: Once | INTRAMUSCULAR | Status: AC
  Administered 2024-08-07: 1 g via INTRAMUSCULAR
  Filled 2024-08-07: qty 1000

## 2024-08-07 MED ORDER — LIDOCAINE HCL (PF) 1 % IJ SOLN
5.0000 mL | Freq: Once | INTRAMUSCULAR | Status: AC
  Administered 2024-08-07: 5 mL
  Filled 2024-08-07: qty 5

## 2024-08-07 NOTE — MAU Note (Signed)
 Caitlin Marks is a 33 y.o. at [redacted]w[redacted]d here in MAU reporting: here for IV abx for UTI Reports she is no longer feeling abdominal pain. Denies any CTXs, LOF or VB. Reports feeling some fetal movement.  States she has been coughing for a few days, dry cough. Denies any fever, sore throat or sinus congested.   Pain score: 0 Vitals:   08/07/24 1805  BP: 113/73  Pulse: 100  Resp: 18  SpO2: 100%     QYU:izqzmmzi, pt wearing a dress  Lab orders placed from triage:

## 2024-08-07 NOTE — MAU Note (Signed)
 Discussed plan of care regarding transportation for follow up visits to MAU for the next 6 days for IM antibiotics. Patient states that she will catch the bus here tomorrow around 2 pm for her next injection. Per Dr. Magali patient can receive antibiotic anywhere between 15-28 hours after initial dose. Patient states that she will be able to get here by bus or family/friend for the duration of the week, but will need assistance with getting home. Patient understands the plan of care.  Plan discussed with Dr. Cashion and Paige Grady[facility]

## 2024-08-07 NOTE — Telephone Encounter (Signed)
 Patient seen in MAU on 9/25 for abdominal pain. HIV (+). OB culture came back positive for >100,000 cfu of ESBL Klebsiella. Attempted to call patient at primary phone number multiple times and phone not in service. Attempted to call mobile and it keeps ringing without going to voicemail. Attempted to call [facility] and it rings without going to voicemail. Attempted to call [facility] [employer] and they are closed on Sundays. Patient is not signed up for access to MyChart.   Spoke with pharmacy who stated patient would need IV carbapenems for treatment. Pharmacy contacted social work to help in contacting the patient.  Patient has an appointment with MFM tomorrow. Message sent to Dr. Arna to see if we could inform her to come to hospital for IV antibiotics at appointment tomorrow.

## 2024-08-07 NOTE — MAU Provider Note (Signed)
 History     [id number]  Arrival date and time: 08/07/24 1742    Chief Complaint  Patient presents with   IV Medication   UTI     HPI Caitlin Marks is a 33 y.o. at [redacted]w[redacted]d by LMP, who presents for follow up after MAU visit on 9/25. OB culture at that visit grew >100,000cfu of ESBL Klebsiella pneumoniae. She is currently asymptomatic. She denies dysuria, increased urinary frequency or urgency. She denies abdominal pain. She endorses good fetal movement with no LOF, VB or contractions.    --/--/B POS (09/25 1905)  Past Medical History:  Diagnosis Date   HIV (human immunodeficiency virus infection) (HCC)    HIV (human immunodeficiency virus infection) (HCC) 02/26/2022    Past Surgical History:  Procedure Laterality Date   NO PAST SURGERIES      Family History  Problem Relation Age of Onset   Other Mother    Diabetes Neg Hx    Cancer Neg Hx     Social History   Socioeconomic History   Marital status: Single    Spouse name: Not on file   Number of children: Not on file   Years of education: Not on file   Highest education level: Not on file  Occupational History   Not on file  Tobacco Use   Smoking status: Never    Passive exposure: Past   Smokeless tobacco: Never  Vaping Use   Vaping status: Never Used  Substance and Sexual Activity   Alcohol use: Not Currently    Comment: reports 2-3 bottles beer on some weekends   Drug use: Never   Sexual activity: Yes  Other Topics Concern   Not on file  Social History Narrative   ** Merged History Encounter **       Social Drivers of Health   Financial Resource Strain: Not on file  Food Insecurity: Food Insecurity Present (07/14/2024)   Hunger Vital Sign    Worried About Running Out of Food in the Last Year: Sometimes true    Ran Out of Food in the Last Year: Often true  Transportation Needs: Unmet Transportation Needs (07/14/2024)   PRAPARE - Administrator, Civil Service (Medical): Yes    Lack  of Transportation (Non-Medical): Yes  Physical Activity: Not on file  Stress: Not on file  Social Connections: Not on file  Intimate Partner Violence: Not on file    No Known Allergies  No current facility-administered medications on file prior to encounter.   Current Outpatient Medications on File Prior to Encounter  Medication Sig Dispense Refill   glycopyrrolate  (ROBINUL ) 2 MG tablet Take 1 tablet (2 mg total) by mouth 3 (three) times daily as needed. 30 tablet 3   NIFEdipine  (PROCARDIA  XL) 30 MG 24 hr tablet Take 1 tablet (30 mg total) by mouth 2 (two) times daily. 60 tablet 1   ondansetron  (ZOFRAN ) 4 MG tablet Take 2 tablets (8 mg total) by mouth 2 (two) times daily. 20 tablet 0   Prenatal 27-1 MG TABS Take 1 tablet by mouth daily. 60 tablet 3   bictegravir-emtricitabine -tenofovir  AF (BIKTARVY ) 50-200-25 MG TABS tablet Take 1 tablet by mouth daily. Try to take at the same time each day with or without food. 30 tablet 11   metroNIDAZOLE  (FLAGYL ) 500 MG tablet Take 1 tablet (500 mg total) by mouth 2 (two) times daily for 7 days. 14 tablet 0   scopolamine  (TRANSDERM-SCOP) 1 MG/3DAYS Place 1 patch (1 mg total)  onto the skin every 3 (three) days. 10 patch 0    Pertinent positives and negative per HPI, all others reviewed and negative  Physical Exam   BP 113/73 (BP Location: Right Arm)   Pulse 100   Resp 18   Ht 5' 1 (1.549 m)   Wt 72.4 kg   LMP 02/12/2024 (Approximate)   SpO2 100%   BMI 30.16 kg/m   Patient Vitals for the past 24 hrs:  BP Pulse Resp SpO2 Height Weight  08/07/24 1805 113/73 100 18 100 % 5' 1 (1.549 m) 72.4 kg    Physical Exam Vitals and nursing note reviewed.  Constitutional:      Appearance: She is well-developed.  HENT:     Head: Normocephalic and atraumatic.     Mouth/Throat:     Mouth: Mucous membranes are moist.  Eyes:     Extraocular Movements: Extraocular movements intact.  Cardiovascular:     Rate and Rhythm: Normal rate and regular  rhythm.  Pulmonary:     Effort: Pulmonary effort is normal.  Abdominal:     Palpations: Abdomen is soft.     Tenderness: There is no abdominal tenderness.  Skin:    Capillary Refill: Capillary refill takes less than 2 seconds.  Neurological:     General: No focal deficit present.     Mental Status: She is alert.     Labs No results found for this or any previous visit (from the past 24 hours).  Imaging No results found.  MAU Course  Procedures Lab Orders  No laboratory test(s) ordered today   Meds ordered this encounter  Medications   ertapenem (INVANZ) injection 1 g    My specialty / Organism:   ESBL organism    Antibiotic Indication::   ESBL Infection   DISCONTD: lidocaine (XYLOCAINE) 1 % (with pres) injection 5 mL   lidocaine (PF) (XYLOCAINE) 1 % injection 5 mL   Imaging Orders  No imaging studies ordered today    MDM Moderate (Level 3-4)  Assessment and Plan  #ESBL UTI #[redacted] weeks gestation of pregnancy  Caitlin Marks is a 33 y.o. at [redacted]w[redacted]d by LMP, who presents for follow up after MAU visit on 9/25.   -OB culture from 9/25 (+) for >100,000 cfu of ESBL Klebsiella pneumoniae.  -Consulted pharmacy who recommended 1g Ertapenem IM daily for 7 days. -Patient has difficulty with transportation so cab voucher given so that she can come to MAU tomorrow to receive dose 2/7. Will need to consult social work when she comes tomorrow so we can set up transportation for the remaining injections.  -Stable for discharge home -1g Ertapenem IM administered.  -Patient to present to MAU tomorrow.   Naz Denunzio L Jessi Pitstick, MD/MHA 08/07/24 7:59 PM  Allergies as of 08/07/2024   No Known Allergies      Medication List     TAKE these medications    Biktarvy  50-200-25 MG Tabs tablet Generic drug: bictegravir-emtricitabine -tenofovir  AF Take 1 tablet by mouth daily. Try to take at the same time each day with or without food.   glycopyrrolate  2 MG tablet Commonly known as:  ROBINUL  Take 1 tablet (2 mg total) by mouth 3 (three) times daily as needed.   metroNIDAZOLE  500 MG tablet Commonly known as: FLAGYL  Take 1 tablet (500 mg total) by mouth 2 (two) times daily for 7 days.   NIFEdipine  30 MG 24 hr tablet Commonly known as: Procardia  XL Take 1 tablet (30 mg total) by mouth 2 (two)  times daily.   ondansetron  4 MG tablet Commonly known as: Zofran  Take 2 tablets (8 mg total) by mouth 2 (two) times daily.   Prenatal 27-1 MG Tabs Take 1 tablet by mouth daily.   scopolamine  1 MG/3DAYS Commonly known as: TRANSDERM-SCOP Place 1 patch (1 mg total) onto the skin every 3 (three) days.

## 2024-08-07 NOTE — Telephone Encounter (Addendum)
 CSW was contacted by Glenys Bidding, RPH and Dr. Magali requesting assistance contacting patient to inform to present to the MAU for treatment of UTI. CSW attempted to contact patient using phone numbers listed in patient's chart. [employer] language line used for [employer], interpreter [place], [phone]. [phone] was not in service. [phone], no answer, unable to leave voicemail. [phone], left HIPAA compliant voicemail requesting return call. CSW also attempted patient's emergency contact Dennis Dialo [phone], number not in service.   CSW placed call to [facility] Department non-emergency line [phone] who will send an officer to patient's address within the hour in an attempt to locate her.   5:25PM: CSW received phone call from [facility] officer [place] [phone]) who located patient. Patient is on her way to the hospital. Care team provided update.  Signed,  Sharyne LOIS Roulette, MSW, [place], LCASA 08/07/2024 4:31 PM

## 2024-08-08 ENCOUNTER — Encounter (HOSPITAL_COMMUNITY): Payer: Self-pay | Admitting: Obstetrics and Gynecology

## 2024-08-08 ENCOUNTER — Ambulatory Visit: Admitting: Obstetrics and Gynecology

## 2024-08-08 ENCOUNTER — Ambulatory Visit: Attending: Obstetrics and Gynecology

## 2024-08-08 ENCOUNTER — Inpatient Hospital Stay (HOSPITAL_COMMUNITY)
Admission: AD | Admit: 2024-08-08 | Discharge: 2024-08-08 | Disposition: A | Discharge status: Home / Self Care | Attending: Obstetrics and Gynecology | Admitting: Obstetrics and Gynecology

## 2024-08-08 ENCOUNTER — Other Ambulatory Visit: Payer: Self-pay | Admitting: Family Medicine

## 2024-08-08 ENCOUNTER — Other Ambulatory Visit: Payer: Self-pay | Admitting: *Deleted

## 2024-08-08 DIAGNOSIS — E669 Obesity, unspecified: Secondary | ICD-10-CM

## 2024-08-08 DIAGNOSIS — O99019 Anemia complicating pregnancy, unspecified trimester: Secondary | ICD-10-CM | POA: Diagnosis present

## 2024-08-08 DIAGNOSIS — O10012 Pre-existing essential hypertension complicating pregnancy, second trimester: Secondary | ICD-10-CM | POA: Insufficient documentation

## 2024-08-08 DIAGNOSIS — Z3A23 23 weeks gestation of pregnancy: Secondary | ICD-10-CM

## 2024-08-08 DIAGNOSIS — O0990 Supervision of high risk pregnancy, unspecified, unspecified trimester: Secondary | ICD-10-CM

## 2024-08-08 DIAGNOSIS — B20 Human immunodeficiency virus [HIV] disease: Secondary | ICD-10-CM | POA: Diagnosis not present

## 2024-08-08 DIAGNOSIS — O99012 Anemia complicating pregnancy, second trimester: Secondary | ICD-10-CM

## 2024-08-08 DIAGNOSIS — Z3A25 25 weeks gestation of pregnancy: Secondary | ICD-10-CM | POA: Insufficient documentation

## 2024-08-08 DIAGNOSIS — N390 Urinary tract infection, site not specified: Secondary | ICD-10-CM

## 2024-08-08 DIAGNOSIS — O26892 Other specified pregnancy related conditions, second trimester: Secondary | ICD-10-CM | POA: Diagnosis not present

## 2024-08-08 DIAGNOSIS — O98712 Human immunodeficiency virus [HIV] disease complicating pregnancy, second trimester: Secondary | ICD-10-CM | POA: Diagnosis not present

## 2024-08-08 DIAGNOSIS — O2342 Unspecified infection of urinary tract in pregnancy, second trimester: Secondary | ICD-10-CM | POA: Insufficient documentation

## 2024-08-08 DIAGNOSIS — Z31430 Encounter of female for testing for genetic disease carrier status for procreative management: Secondary | ICD-10-CM

## 2024-08-08 DIAGNOSIS — Z3A16 16 weeks gestation of pregnancy: Secondary | ICD-10-CM

## 2024-08-08 DIAGNOSIS — D573 Sickle-cell trait: Secondary | ICD-10-CM | POA: Insufficient documentation

## 2024-08-08 DIAGNOSIS — B961 Klebsiella pneumoniae [K. pneumoniae] as the cause of diseases classified elsewhere: Secondary | ICD-10-CM | POA: Insufficient documentation

## 2024-08-08 DIAGNOSIS — B9689 Other specified bacterial agents as the cause of diseases classified elsewhere: Secondary | ICD-10-CM

## 2024-08-08 DIAGNOSIS — O99212 Obesity complicating pregnancy, second trimester: Secondary | ICD-10-CM

## 2024-08-08 DIAGNOSIS — I10 Essential (primary) hypertension: Secondary | ICD-10-CM

## 2024-08-08 MED ORDER — LIDOCAINE HCL (PF) 1 % IJ SOLN
INTRAMUSCULAR | Status: AC
  Filled 2024-08-08: qty 5

## 2024-08-08 MED ORDER — ERTAPENEM SODIUM 1 G IJ SOLR
1.0000 g | Freq: Once | INTRAMUSCULAR | Status: AC
  Administered 2024-08-08: 1 g via INTRAMUSCULAR
  Filled 2024-08-08: qty 1000

## 2024-08-08 MED ORDER — SODIUM CHLORIDE 0.9 % IV SOLN: 1.0000 g | Freq: Once | INTRAVENOUS | Status: DC

## 2024-08-08 NOTE — MAU Provider Note (Signed)
 Event Date/Time   First Provider Initiated Contact with Patient 08/08/24 1600      S [name] is a 33 y.o. [id number] patient at [redacted]w[redacted]d gestation who presents to MAU today for antibiotic injection for Urinary tract infection due to ESBL Klebsiella.   O BP 109/66   Pulse 91   Temp 97.6 F (36.4 C)   Resp 18   LMP 02/12/2024 (Approximate)   Physical Exam Vitals and nursing note reviewed.  Constitutional:      Appearance: Normal appearance. She is normal weight.  Genitourinary:    Comments: Not indicated Musculoskeletal:        General: Normal range of motion.  Skin:    General: Skin is warm and dry.  Neurological:     Mental Status: She is alert and oriented to person, place, and time.  Psychiatric:        Mood and Affect: Mood normal.        Behavior: Behavior normal.        Thought Content: Thought content normal.        Judgment: Judgment normal.    FHTs by doppler: 146 bpm  A Medical screening exam complete 1. Urinary tract infection due to ESBL Klebsiella (Primary)  2. [redacted] weeks gestation of pregnancy    P Discharge from MAU in stable condition Continue with current treatment plan of returning to MAU for daily Ertapenem injection x 7 days; today is Day 2 out of 7 Warning signs for worsening condition that would warrant emergency follow-up discussed Patient may return to MAU as needed   Letha Renshaw, CNM 08/08/2024 4:16 PM

## 2024-08-08 NOTE — MAU Note (Signed)
 Pt here for antibiotic injection . Denies any pain or cramping and reports good fetal movement.

## 2024-08-08 NOTE — Progress Notes (Signed)
 Maternal-Fetal Medicine Consultation  Name: Caitlin Marks  MRN: [id number]  GA: [id number] [redacted]w[redacted]d   Patient is here for fetal anatomy scan.  I obtained history and counseled the patient with the help of [place] language interpreter present in the room.  Her high-risk problems include: - HIV in pregnancy.  Patient takes Biktarvy  regularly. - Chronic hypertension.  Patient takes nifedipine  XL 30 mg daily.  Blood pressure today at our office is 105/62 mmHg. - Urine infection.  Urine culture showed Klebsiella pneumoniae and is sensitive only to meropenem, piperacillin and cefepime.  Patient will be going over to the MAU today for IV antibiotics. - Sickle cell trait.  Partners carrier status is not known. Obstetric history: 2 term vaginal deliveries.  None of her children is affected with HIV. Patient had fetal anatomical survey at the MAU 4 days ago.  No obvious fetal structural defects were seen.  Labs Low risk for fetal aneuploidy's on cell-free fetal DNA screening. MSAFP screening showed low risk for open neural tube defects. HIV quantitative RNA 50 copies/mL.  Plan absolute CD4 count 880.  Ultrasound A limited ultrasound study was performed. Amniotic fluid is normal and good fetal activity seen. Fetal anatomical survey was completed and appears normal.  Urinary tract infection (UTI) I emphasized the importance of treatment of lower urinary tract infection.  Asymptomatic bacteriuria is common in pregnancy and untreated UTI can lead to acute pyelonephritis with maternal and fetal complications.  Patient will be going over to the MAU today to start IV antibiotics.  HIV in pregnancy I encouraged her to continue antiretroviral medications to reduce the likelihood of perinatal transmission. If viral RNA is less than thousand copies/mL, she can safely attempt vaginal delivery. Antiviral medications reduced perinatal transmission to less than 1% but does not eliminate the risk. Intrapartum  zidovudine prophylaxis will be based on viral RNA counts at delivery. We recommend serial fetal growth assessments in this pregnancy.  Chronic hypertension I counseled the patient on possible adverse outcomes including maternal stroke, superimposed preeclampsia and placental abruption.  Patient takes low-dose aspirin prophylaxis that delays are prevents preeclampsia. I discussed the importance of antihypertensives. I discussed ultrasound protocol of monitoring fetal growth assessments and weekly antenatal testing from [redacted] weeks gestation until delivery. - Timing of delivery: If hypertension is well-controlled, delivery at [redacted] weeks gestation is appropriate.  Sickle cell trait I discussed the genetics and recommended partner screening.  If her partner is a carrier, there is a 1 in 4 chance of the fetus being affected with sickle cell disease.     Recommendations -Fetal growth assessments every 4 weeks. - Weekly BPP or NST from [redacted] weeks gestation until delivery. - Consider delivery at [redacted] weeks gestation if hypertension is well-controlled. - Follow with Infectious Diseases for HIV management.   Consultation including face-to-face (more than 50%) counseling 45 minutes.

## 2024-08-09 ENCOUNTER — Inpatient Hospital Stay (HOSPITAL_COMMUNITY)
Admission: AD | Admit: 2024-08-09 | Discharge: 2024-08-09 | Disposition: A | Discharge status: Home / Self Care | Payer: Self-pay | Attending: Obstetrics and Gynecology | Admitting: Obstetrics and Gynecology

## 2024-08-09 DIAGNOSIS — O26892 Other specified pregnancy related conditions, second trimester: Secondary | ICD-10-CM | POA: Diagnosis not present

## 2024-08-09 DIAGNOSIS — Z3A23 23 weeks gestation of pregnancy: Secondary | ICD-10-CM

## 2024-08-09 DIAGNOSIS — B961 Klebsiella pneumoniae [K. pneumoniae] as the cause of diseases classified elsewhere: Secondary | ICD-10-CM | POA: Diagnosis not present

## 2024-08-09 DIAGNOSIS — O2342 Unspecified infection of urinary tract in pregnancy, second trimester: Secondary | ICD-10-CM | POA: Insufficient documentation

## 2024-08-09 DIAGNOSIS — Z1612 Extended spectrum beta lactamase (ESBL) resistance: Secondary | ICD-10-CM | POA: Diagnosis not present

## 2024-08-09 DIAGNOSIS — B9689 Other specified bacterial agents as the cause of diseases classified elsewhere: Secondary | ICD-10-CM

## 2024-08-09 DIAGNOSIS — N390 Urinary tract infection, site not specified: Secondary | ICD-10-CM

## 2024-08-09 MED ORDER — LIDOCAINE HCL (PF) 1 % IJ SOLN
5.0000 mL | Freq: Once | INTRAMUSCULAR | Status: AC
  Administered 2024-08-09: 5 mL
  Filled 2024-08-09: qty 5

## 2024-08-09 MED ORDER — ERTAPENEM SODIUM 1 G IJ SOLR
1.0000 g | INTRAMUSCULAR | Status: DC
  Administered 2024-08-09: 1 g via INTRAMUSCULAR
  Filled 2024-08-09: qty 1000

## 2024-08-09 MED ORDER — ERTAPENEM SODIUM 1 G IJ SOLR: 1.0000 g | Freq: Once | INTRAMUSCULAR | Status: DC

## 2024-08-09 NOTE — MAU Note (Signed)
 Caitlin Marks is a 33 y.o. at [redacted]w[redacted]d here in MAU reporting: here for daily antibiotic injection. No problems with the injection. Denies any pain or bleeding. Is feeling the baby move.  Pain score: none Vitals:   08/09/24 1558  BP: 108/74  Pulse: 94  Resp: 17  Temp: 98.5 F (36.9 C)  SpO2: 100%     FHT:147 Lab orders placed from triage:

## 2024-08-09 NOTE — MAU Provider Note (Signed)
 Swahili inter AMN Audio/visual used during the entire encounter   S [name] is a 33 y.o. [phone] patient who presents to MAU today with complaint of she is 23 weeks 3 days gestation who presents today for an antibiotic injection due to UTI with ESBL Klebsiella and is here for her 3rd/7 dose of Ertapenem.     O BP 108/74 (BP Location: Right Arm)   Pulse 94   Temp 98.5 F (36.9 C) (Oral)   Resp 17   Ht 5' 1 (1.549 m)   Wt 74.3 kg   LMP 02/12/2024 (Approximate)   SpO2 100%   BMI 30.93 kg/m  Physical Exam Vitals and nursing note reviewed.  Constitutional:      General: She is not in acute distress.    Appearance: Normal appearance. She is obese. She is not ill-appearing.  HENT:     Head: Normocephalic.     Nose: Nose normal.  Cardiovascular:     Rate and Rhythm: Normal rate.  Pulmonary:     Effort: Pulmonary effort is normal.  Abdominal:     Palpations: Abdomen is soft.     Tenderness: There is no abdominal tenderness.  Musculoskeletal:        General: Normal range of motion.     Cervical back: Normal range of motion.  Skin:    General: Skin is warm.  Neurological:     Mental Status: She is alert and oriented to person, place, and time.  Psychiatric:        Behavior: Behavior normal.    MDM  Moderate   Orders Placed This Encounter  Procedures   Discharge patient Discharge disposition: 01-Home or Self Care; Discharge patient date: 08/09/2024    Standing Status:   Standing    Number of Occurrences:   1    Discharge disposition:   01-Home or Self Care [1]    Discharge patient date:   08/09/2024    Meds ordered this encounter  Medications   DISCONTD: ertapenem (INVANZ) injection 1 g    My specialty / Organism:   ESBL organism    Antibiotic Indication::   ESBL Infection   AND Linked Order Group    ertapenem [facility]) injection 1 g     My specialty / Organism:   ESBL organism     Antibiotic Indication::   ESBL Infection    lidocaine (PF)  (XYLOCAINE) 1 % injection 5 mL     I have reviewed the patient chart and performed the physical exam .  Medications ordered as stated above.  A/P as described below.  Counseling and education provided and patient agreeable  with plan as described below. Verbalized understanding.    ASSESSMENT Medical screening exam complete Urinary tract infection due to ESBL Klebsiella  [redacted] weeks gestation of pregnancy     PLAN Future Appointments  Date Time Provider Department Center  08/11/2024  1:15 PM Cleatus Moccasin, MD [facility]  08/29/2024  3:00 PM Melvenia Corean SAILOR, NP [facility]  09/12/2024  2:15 PM [facility] PROVIDER 1 [facility]  09/12/2024  2:30 PM [facility] US5 [facility]  10/10/2024  2:15 PM [facility] PROVIDER 1 [facility]  10/10/2024  2:30 PM [facility] US5 [facility]  12/12/2024  2:10 PM [name], MD [facility]    Discharge from MAU in stable condition  Return tomorrow for 4th dose out of 7 Total   See AVS for full description of educational information and instructions provided to  the patient at time of discharge  Warning signs for worsening condition that would warrant emergency follow-up discussed Patient may return to MAU as needed   [name] LABOR, NP 08/09/2024 4:27 PM   This chart was dictated using voice recognition software, Dragon. Despite the best efforts of this provider to proofread and correct errors, errors may still occur which can change documentation meaning.

## 2024-08-10 ENCOUNTER — Other Ambulatory Visit: Payer: Self-pay

## 2024-08-10 ENCOUNTER — Inpatient Hospital Stay (HOSPITAL_COMMUNITY)
Admission: AD | Admit: 2024-08-10 | Discharge: 2024-08-10 | Disposition: A | Discharge status: Home / Self Care | Payer: Self-pay | Attending: Obstetrics and Gynecology | Admitting: Obstetrics and Gynecology

## 2024-08-10 DIAGNOSIS — B9689 Other specified bacterial agents as the cause of diseases classified elsewhere: Secondary | ICD-10-CM

## 2024-08-10 DIAGNOSIS — Z3A23 23 weeks gestation of pregnancy: Secondary | ICD-10-CM

## 2024-08-10 DIAGNOSIS — Z1612 Extended spectrum beta lactamase (ESBL) resistance: Secondary | ICD-10-CM | POA: Diagnosis not present

## 2024-08-10 DIAGNOSIS — O26892 Other specified pregnancy related conditions, second trimester: Secondary | ICD-10-CM

## 2024-08-10 DIAGNOSIS — Z603 Acculturation difficulty: Secondary | ICD-10-CM | POA: Diagnosis not present

## 2024-08-10 DIAGNOSIS — Z758 Other problems related to medical facilities and other health care: Secondary | ICD-10-CM

## 2024-08-10 DIAGNOSIS — N390 Urinary tract infection, site not specified: Secondary | ICD-10-CM | POA: Diagnosis not present

## 2024-08-10 DIAGNOSIS — O2342 Unspecified infection of urinary tract in pregnancy, second trimester: Secondary | ICD-10-CM | POA: Insufficient documentation

## 2024-08-10 DIAGNOSIS — B961 Klebsiella pneumoniae [K. pneumoniae] as the cause of diseases classified elsewhere: Secondary | ICD-10-CM | POA: Diagnosis not present

## 2024-08-10 MED ORDER — ERTAPENEM SODIUM 1 G IJ SOLR
1.0000 g | Freq: Once | INTRAMUSCULAR | Status: AC
  Administered 2024-08-10: 1 g via INTRAMUSCULAR
  Filled 2024-08-10: qty 1000

## 2024-08-10 MED ORDER — LIDOCAINE HCL (PF) 1 % IJ SOLN
3.2000 mL | Freq: Once | INTRAMUSCULAR | Status: AC
  Administered 2024-08-10: 3.2 mL
  Filled 2024-08-10: qty 5

## 2024-08-10 NOTE — Congregational Nurse Program (Signed)
  Dept: [phone]   Congregational Nurse Program Note  Date of Encounter: 08/10/2024  Past Medical History: Past Medical History:  Diagnosis Date   HIV (human immunodeficiency virus infection) (HCC)    HIV (human immunodeficiency virus infection) (HCC) 02/26/2022    Encounter Details:  Community Questionnaire - 08/10/24 1227       Questionnaire   Ask client: Do you give verbal consent for me to treat you today? Yes    Student Assistance N/A   Ezra Person, EMT   Location Patient Served  [facility]    Encounter Setting Phone/Text/Email    Population Status Migrant/Refugee    Insurance Medicaid    Insurance/Financial Assistance Referral N/A    Medication Have Medication Insecurities    Medical Provider Yes    Screening Referrals Made N/A    Medical Referrals Made N/A    Medical Appointment Completed [facility]inic;[facility] PCP/Clinic;ED    CNP Interventions Advocate/Support;Navigate Healthcare System;Case Management;Counsel;Educate    Screenings CN Performed N/A    ED Visit Averted N/A    Life-Saving Intervention Made N/A         I have called patient over the phone to check on her. She reports to be doing better. She is getting ready to go for antibiotic infusion. Emotional support provided. Patient encouraged to call for questions or concerns. I will continue to assist as needed.   Naomie Naw Lasala RN BSN PCCN  [facility] [phone]-office

## 2024-08-10 NOTE — MAU Provider Note (Signed)
 Swahili interpreter [phone] AMN (AMN) used during entire encounter  S [name] is a 33 y.o. [phone] patient who presents to the MAU today at 23 weeks 5 days for her antibiotic injection due to UTI with ESBL Klebsiella and is here for her fourth out of 7 doses of . Ertapenem.  She offers no complaints today and denies any vaginal bleeding, leaking of fluid, abdominal cramping and reports good fetal movements  The remainder the ROS is negative unless otherwise noted in HPI     O BP 109/71 (BP Location: Right Arm)   Pulse 97   Temp 98 F (36.7 C) (Oral)   Resp 20   Wt 75.4 kg   LMP 02/12/2024 (Approximate)   SpO2 99%   BMI 31.40 kg/m  Physical Exam Vitals and nursing note reviewed. Exam conducted with a chaperone present.  Constitutional:      General: She is not in acute distress.    Appearance: Normal appearance. She is not ill-appearing.  HENT:     Head: Normocephalic.     Nose: Nose normal.     Mouth/Throat:     Mouth: Mucous membranes are moist.  Cardiovascular:     Rate and Rhythm: Normal rate.  Pulmonary:     Effort: Pulmonary effort is normal.  Abdominal:     Palpations: Abdomen is soft.  Musculoskeletal:        General: Normal range of motion.     Cervical back: Normal range of motion.  Skin:    General: Skin is warm.  Neurological:     Mental Status: She is alert and oriented to person, place, and time.  Psychiatric:        Mood and Affect: Mood normal.        Behavior: Behavior normal.     MDM  MODERATE  Meds ordered this encounter  Medications   ertapenem (INVANZ) injection 1 g    My specialty / Organism:   ESBL organism    Antibiotic Indication::   ESBL Infection   lidocaine (PF) (XYLOCAINE) 1 % injection 3.2 mL     Orders Placed This Encounter  Procedures   Discharge patient Discharge disposition: 01-Home or Self Care; Discharge patient date: 08/10/2024    Standing Status:   Standing    Number of Occurrences:   1    Discharge  disposition:   01-Home or Self Care [1]    Discharge patient date:   08/10/2024   Discharge patient Discharge disposition: 01-Home or Self Care; Discharge patient date: 08/10/2024    Standing Status:   Standing    Number of Occurrences:   1    Discharge disposition:   01-Home or Self Care [1]    Discharge patient date:   08/10/2024      I have reviewed the patient chart and performed the physical exam Medications ordered as stated above A/P as described below.  Counseling and education provided and patient agreeable  with plan as described below. Verbalized understanding.    ASSESSMENT Medical screening exam complete Urinary tract infection due to ESBL Klebsiella  [redacted] weeks gestation of pregnancy  Language barrier     PLAN Future Appointments  Date Time Provider Department Center  08/11/2024  1:15 PM Cleatus Moccasin, MD [facility]  08/29/2024  3:00 PM Melvenia Corean SAILOR, NP [facility]  09/12/2024  2:15 PM [facility] PROVIDER 1 [facility]  09/12/2024  2:30 PM [facility] US5 [facility]  10/10/2024  2:15 PM [facility] PROVIDER 1  [facility]  10/10/2024  2:30 PM [facility] US5 [facility]  12/12/2024  2:10 PM [name], MD [facility]    Discharge from MAU in stable condition  Return to MAU tomorrow for injection # 5/7   See AVS for full description of educational information and instructions provided to the patient at time of discharge   Warning signs for worsening condition that would warrant emergency follow-up discussed Patient may return to MAU as needed   [name] LABOR, NP 08/10/2024 4:09 PM   This chart was dictated using voice recognition software, Dragon. Despite the best efforts of this provider to proofread and correct errors, errors may still occur which can change documentation meaning.

## 2024-08-10 NOTE — Discharge Instructions (Signed)
 Return tomorrow for next dose of injection 5/7

## 2024-08-10 NOTE — MAU Note (Signed)
 Caitlin Marks is a 33 y.o. at [redacted]w[redacted]d here in MAU reporting: she's here to receive an injection.  Denies VB or LOF.  Reports +FM.  Denies any issues.  LMP: 02/12/2024 Onset of complaint: today Pain score: 0 Vitals:   08/10/24 1533  BP: 109/71  Pulse: 97  Resp: 20  Temp: 98 F (36.7 C)  SpO2: 99%     FHT: 154 bpm  Lab orders placed from triage: None

## 2024-08-11 ENCOUNTER — Encounter (HOSPITAL_COMMUNITY): Payer: Self-pay | Admitting: Obstetrics and Gynecology

## 2024-08-11 ENCOUNTER — Inpatient Hospital Stay (HOSPITAL_COMMUNITY)
Admission: AD | Admit: 2024-08-11 | Discharge: 2024-08-11 | Disposition: A | Discharge status: Home / Self Care | Payer: Self-pay | Attending: Obstetrics and Gynecology | Admitting: Obstetrics and Gynecology

## 2024-08-11 ENCOUNTER — Ambulatory Visit: Admitting: Obstetrics and Gynecology

## 2024-08-11 ENCOUNTER — Other Ambulatory Visit (HOSPITAL_COMMUNITY): Payer: Self-pay

## 2024-08-11 VITALS — BP 100/65 | HR 103 | Wt 165.0 lb

## 2024-08-11 DIAGNOSIS — Z1612 Extended spectrum beta lactamase (ESBL) resistance: Secondary | ICD-10-CM | POA: Diagnosis not present

## 2024-08-11 DIAGNOSIS — O10912 Unspecified pre-existing hypertension complicating pregnancy, second trimester: Secondary | ICD-10-CM

## 2024-08-11 DIAGNOSIS — O0990 Supervision of high risk pregnancy, unspecified, unspecified trimester: Secondary | ICD-10-CM

## 2024-08-11 DIAGNOSIS — N390 Urinary tract infection, site not specified: Secondary | ICD-10-CM

## 2024-08-11 DIAGNOSIS — O2342 Unspecified infection of urinary tract in pregnancy, second trimester: Secondary | ICD-10-CM | POA: Diagnosis present

## 2024-08-11 DIAGNOSIS — B20 Human immunodeficiency virus [HIV] disease: Secondary | ICD-10-CM

## 2024-08-11 DIAGNOSIS — Z3A23 23 weeks gestation of pregnancy: Secondary | ICD-10-CM | POA: Diagnosis not present

## 2024-08-11 DIAGNOSIS — O10919 Unspecified pre-existing hypertension complicating pregnancy, unspecified trimester: Secondary | ICD-10-CM | POA: Insufficient documentation

## 2024-08-11 DIAGNOSIS — B9689 Other specified bacterial agents as the cause of diseases classified elsewhere: Secondary | ICD-10-CM

## 2024-08-11 DIAGNOSIS — O26892 Other specified pregnancy related conditions, second trimester: Secondary | ICD-10-CM | POA: Diagnosis not present

## 2024-08-11 DIAGNOSIS — O0992 Supervision of high risk pregnancy, unspecified, second trimester: Secondary | ICD-10-CM

## 2024-08-11 DIAGNOSIS — B961 Klebsiella pneumoniae [K. pneumoniae] as the cause of diseases classified elsewhere: Secondary | ICD-10-CM | POA: Insufficient documentation

## 2024-08-11 MED ORDER — SODIUM CHLORIDE 0.9 % IV SOLN: 1.0000 g | Freq: Once | INTRAVENOUS | Status: DC

## 2024-08-11 MED ORDER — ERTAPENEM SODIUM 1 G IJ SOLR
1.0000 g | Freq: Once | INTRAMUSCULAR | Status: AC
  Administered 2024-08-11: 1 g via INTRAMUSCULAR
  Filled 2024-08-11: qty 1000

## 2024-08-11 MED ORDER — LIDOCAINE HCL (PF) 1 % IJ SOLN
3.2000 mL | Freq: Once | INTRAMUSCULAR | Status: AC
  Administered 2024-08-11: 3.2 mL
  Filled 2024-08-11: qty 5

## 2024-08-11 MED ORDER — ASPIRIN 81 MG PO TBEC
81.0000 mg | DELAYED_RELEASE_TABLET | Freq: Every day | ORAL | 12 refills | Status: DC
  Filled 2024-08-11: qty 30, 30d supply, fill #0

## 2024-08-11 NOTE — MAU Note (Signed)
 Caitlin Marks is a 33 y.o. at [redacted]w[redacted]d here in MAU reporting: here for antibiotic injection, day 5 orf 7.  No complaints.  Denies bleeding or leaking. Denies pain. Reports +FM  Onset of complaint: none Pain score: none Vitals:   08/11/24 1451  BP: 107/69  Pulse: 97  Resp: 17  Temp: 98.4 F (36.9 C)  SpO2: 100%     FHT:155 Lab orders placed from triage:

## 2024-08-11 NOTE — MAU Provider Note (Signed)
 Event Date/Time   First Provider Initiated Contact with Patient 08/11/24 1456      S Caitlin Marks is a 33 y.o. [phone] patient who presents to MAU today with complaint of for antibiotic injection for UTI complicated by ESBL Klebsiella. Today is day 5 of 7. She has no complaints today. She denies pain, VB or LOF. She reports good (+) FM.   O BP 107/69 (BP Location: Right Arm)   Pulse 97   Temp 98.4 F (36.9 C) (Oral)   Resp 17   Ht 5' 1 (1.549 m)   Wt 75.3 kg   LMP 02/12/2024 (Approximate)   SpO2 100%   BMI 31.37 kg/m   Physical Exam Vitals and nursing note reviewed.  Constitutional:      Appearance: Normal appearance. She is normal weight.  Cardiovascular:     Rate and Rhythm: Normal rate.  Skin:    General: Skin is warm and dry.  Neurological:     Mental Status: She is alert and oriented to person, place, and time.  Psychiatric:        Mood and Affect: Mood normal.        Behavior: Behavior normal.        Thought Content: Thought content normal.        Judgment: Judgment normal.    A Medical screening exam complete 1. Urinary tract infection due to ESBL Klebsiella (Primary)  2. [redacted] weeks gestation of pregnancy    P Discharge from MAU in stable condition Warning signs for worsening condition that would warrant emergency follow-up discussed Patient to return to MAU tomorrow for day 6 injection of Ertapenam 1 gm. Patient verbalized an understanding of the plan of care and agrees.   Letha Renshaw, CNM 08/11/2024 2:56 PM

## 2024-08-11 NOTE — Progress Notes (Signed)
   PRENATAL VISIT NOTE  Subjective:  Caitlin Marks is a 33 y.o. G3P2002 at [redacted]w[redacted]d being seen today for ongoing prenatal care.  She is currently monitored for the following issues for this high-risk pregnancy and has HIV (human immunodeficiency virus infection) (HCC); Primary hypertension; Supervision of high risk pregnancy, antepartum; and Chronic hypertension affecting pregnancy on their problem list.  Patient reports no complaints.  Contractions: Not present. Vag. Bleeding: None.  Movement: Present. Denies leaking of fluid.   The following portions of the patient's history were reviewed and updated as appropriate: allergies, current medications, past family history, past medical history, past social history, past surgical history and problem list.   Objective:    Vitals:   08/11/24 1324  BP: 100/65  Pulse: (!) 103  Weight: 165 lb (74.8 kg)    Fetal Status:  Fetal Heart Rate (bpm): 160   Movement: Present    General: Alert, oriented and cooperative. Patient is in no acute distress.  Skin: Skin is warm and dry. No rash noted.   Cardiovascular: Normal heart rate noted  Respiratory: Normal respiratory effort, no problems with respiration noted  Abdomen: Soft, gravid, appropriate for gestational age.  Pain/Pressure: Absent     Pelvic: Cervical exam deferred        Extremities: Normal range of motion.  Edema: None  Mental Status: Normal mood and affect. Normal behavior. Normal judgment and thought content.   Assessment and Plan:  Pregnancy: G3P2002 at [redacted]w[redacted]d 1. Supervision of high risk pregnancy, antepartum (Primary) Recommend flu shot - pt declines Discussed 28w labs next visit.  No longer with nausea or spitting. Went over meds and she will stop robinul  and zofran .   2. Pregnancy with 23 completed weeks gestation  3. Currently asymptomatic HIV infection, with history of HIV-related illness (HCC) Has appt with ID on 10/20. Last levels were in July.  She is on her ART.   4.  Chronic hypertension affecting pregnancy Nifed 30 bid.  Aspirin - started her on this today.  Growth US  is on 11/3. Normal on 9/27.   Preterm labor symptoms and general obstetric precautions including but not limited to vaginal bleeding, contractions, leaking of fluid and fetal movement were reviewed in detail with the patient. Please refer to After Visit Summary for other counseling recommendations.   Return in about 4 weeks (around 09/08/2024) for OB VISIT, MD or APP, 2 hr GTT/28w labs.  Future Appointments  Date Time Provider Department Center  08/29/2024  3:00 PM Melvenia Corean SAILOR, NP [facility]  09/12/2024  2:15 PM [facility] PROVIDER 1 [facility]  09/12/2024  2:30 PM [facility] US5 [facility]  10/10/2024  2:15 PM [facility] PROVIDER 1 [facility]  10/10/2024  2:30 PM [facility] US5 [facility]  12/12/2024  2:10 PM [name], MD [facility], MD

## 2024-08-12 ENCOUNTER — Other Ambulatory Visit: Payer: Self-pay

## 2024-08-12 ENCOUNTER — Inpatient Hospital Stay (HOSPITAL_COMMUNITY)
Admission: AD | Admit: 2024-08-12 | Discharge: 2024-08-12 | Disposition: A | Discharge status: Home / Self Care | Payer: Self-pay | Attending: Family Medicine | Admitting: Family Medicine

## 2024-08-12 ENCOUNTER — Other Ambulatory Visit (HOSPITAL_COMMUNITY): Payer: Self-pay

## 2024-08-12 DIAGNOSIS — O2342 Unspecified infection of urinary tract in pregnancy, second trimester: Secondary | ICD-10-CM | POA: Insufficient documentation

## 2024-08-12 DIAGNOSIS — N390 Urinary tract infection, site not specified: Secondary | ICD-10-CM

## 2024-08-12 DIAGNOSIS — Z1612 Extended spectrum beta lactamase (ESBL) resistance: Secondary | ICD-10-CM | POA: Insufficient documentation

## 2024-08-12 DIAGNOSIS — Z3A24 24 weeks gestation of pregnancy: Secondary | ICD-10-CM | POA: Diagnosis not present

## 2024-08-12 DIAGNOSIS — O26892 Other specified pregnancy related conditions, second trimester: Secondary | ICD-10-CM

## 2024-08-12 DIAGNOSIS — Z603 Acculturation difficulty: Secondary | ICD-10-CM | POA: Diagnosis not present

## 2024-08-12 DIAGNOSIS — Z758 Other problems related to medical facilities and other health care: Secondary | ICD-10-CM | POA: Diagnosis not present

## 2024-08-12 DIAGNOSIS — B961 Klebsiella pneumoniae [K. pneumoniae] as the cause of diseases classified elsewhere: Secondary | ICD-10-CM | POA: Insufficient documentation

## 2024-08-12 DIAGNOSIS — B9689 Other specified bacterial agents as the cause of diseases classified elsewhere: Secondary | ICD-10-CM

## 2024-08-12 MED ORDER — ERTAPENEM SODIUM 1 G IJ SOLR
1.0000 g | Freq: Once | INTRAMUSCULAR | Status: AC
  Administered 2024-08-12: 1 g via INTRAMUSCULAR
  Filled 2024-08-12: qty 1000

## 2024-08-12 MED ORDER — SODIUM CHLORIDE 0.9 % IV SOLN: 1.0000 g | Freq: Once | INTRAVENOUS | Status: DC

## 2024-08-12 MED ORDER — LIDOCAINE HCL (PF) 1 % IJ SOLN
5.0000 mL | Freq: Once | INTRAMUSCULAR | Status: AC
  Administered 2024-08-12: 5 mL
  Filled 2024-08-12: qty 5

## 2024-08-12 NOTE — MAU Note (Signed)
 Caitlin Marks is a 33 y.o. at [redacted]w[redacted]d here in MAU reporting: here for antibiotic injection, day 6 of 7. Denies bleeding, leaking or CTXs. Denies pain. Reports +FM  Denies any complications or issues with injections so far.   Pain score: 0 Vitals:   08/12/24 1439  BP: 108/70  Pulse: 99  Resp: 18  Temp: 98.1 F (36.7 C)  SpO2: 97%     FHT:158 Lab orders placed from triage:  med

## 2024-08-12 NOTE — MAU Provider Note (Signed)
 Chief Complaint:  Follow-up   None     HPI: Caitlin Marks is a 33 y.o. G3P2002 at [redacted]w[redacted]d who presents to maternity admissions for day 6 of 7 day series of antibiotic injections. Pt was dx with UTI with Klebsiella Pneumonia, ESBL on 08/04/24.  Injections of ertapenem 1 g were ordered daily for 7 days. She denies any pain or bleeding and denies side effects of the medication.  She is feeling good fetal movement and denies contractions.   HPI  Past Medical History: Past Medical History:  Diagnosis Date   HIV (human immunodeficiency virus infection) (HCC)    HIV (human immunodeficiency virus infection) (HCC) 02/26/2022    Past obstetric history: OB History  Gravida Para Term Preterm AB Living  3 2 2  0 0 2  SAB IAB Ectopic Multiple Live Births  0 0 0  2    # Outcome Date GA Lbr Len/2nd Weight Sex Type Anes PTL Lv  3 Current           2 Term 12/23/16 [redacted]w[redacted]d   M Vag-Spont None  LIV  1 Term 12/04/08 [redacted]w[redacted]d   F Vag-Spont None  LIV    Past Surgical History: Past Surgical History:  Procedure Laterality Date   NO PAST SURGERIES      Family History: Family History  Problem Relation Age of Onset   Other Mother    Diabetes Neg Hx    Cancer Neg Hx     Social History: Social History   Tobacco Use   Smoking status: Never    Passive exposure: Past   Smokeless tobacco: Never  Vaping Use   Vaping status: Never Used  Substance Use Topics   Alcohol use: Not Currently    Comment: reports 2-3 bottles beer on some weekends   Drug use: Never    Allergies: No Known Allergies  Meds:  No medications prior to admission.    ROS:  Review of Systems  Constitutional:  Negative for chills, fatigue and fever.  Eyes:  Negative for visual disturbance.  Respiratory:  Negative for shortness of breath.   Cardiovascular:  Negative for chest pain.  Gastrointestinal:  Negative for abdominal pain, nausea and vomiting.  Genitourinary:  Negative for difficulty urinating, dysuria, flank  pain, pelvic pain, vaginal bleeding, vaginal discharge and vaginal pain.  Neurological:  Negative for dizziness and headaches.  Psychiatric/Behavioral: Negative.       I have reviewed patient's Past Medical Hx, Surgical Hx, Family Hx, Social Hx, medications and allergies.   Physical Exam  Patient Vitals for the past 24 hrs:  BP Temp Temp src Pulse Resp SpO2 Height Weight  08/12/24 1439 108/70 98.1 F (36.7 C) Oral 99 18 97 % 5' 1 (1.549 m) 75.8 kg   Constitutional: Well-developed, well-nourished female in no acute distress.  Cardiovascular: normal rate Respiratory: normal effort GI: Abd soft, non-tender, gravid appropriate for gestational age.  MS: Extremities nontender, no edema, normal ROM Neurologic: Alert and oriented x 4.  GU: Neg CVAT.  PELVIC EXAM: deferred    FHT:  158 by doppler   Labs: No results found for this or any previous visit (from the past 24 hours). --/--/B POS (09/25 1905)  Imaging:    MAU Course/MDM: Orders Placed This Encounter  Procedures   Discharge patient Discharge disposition: 01-Home or Self Care; Discharge patient date: 08/12/2024    Meds ordered this encounter  Medications   DISCONTD: ertapenem (INVANZ) 1 g in sodium chloride 0.9 % 100 mL IVPB  My specialty / Organism:   ESBL organism    Antibiotic Indication::   ESBL Infection   ertapenem (INVANZ) injection 1 g    My specialty / Organism:   ESBL organism    Antibiotic Indication::   ESBL Infection   lidocaine (PF) (XYLOCAINE) 1 % injection 5 mL     Pt without symptoms today.  Dose of medication given IM by RN. Pt tolerated well.  D/C home.  Last injection tomorrow.  F/U with prenatal care as scheduled.     Assessment: 1. Urinary tract infection due to ESBL Klebsiella   2. [redacted] weeks gestation of pregnancy     Plan: Discharge home Labor precautions and fetal kick counts  Follow-up Information     [facility] Follow up.    Specialty: Obstetrics and Gynecology Why: For prenatal visits Contact information: [address] [place]  [address] [phone]        [facility] Follow up.   Specialty: Obstetrics and Gynecology Why: For medication on August 13, 2024, then as needed for emergencies. Contact information: [address] [place]  [phone]               Allergies as of 08/12/2024   No Known Allergies      Medication List     STOP taking these medications    aspirin EC 81 MG tablet   Biktarvy  50-200-25 MG Tabs tablet Generic drug: bictegravir-emtricitabine -tenofovir  AF   glycopyrrolate  2 MG tablet Commonly known as: ROBINUL    metroNIDAZOLE  500 MG tablet Commonly known as: FLAGYL    NIFEdipine  30 MG 24 hr tablet Commonly known as: Procardia  XL   ondansetron  4 MG tablet Commonly known as: Zofran    Prenatal 27-1 MG Tabs   scopolamine  1 MG/3DAYS Commonly known as: ANTOINETTE Olam Boards Certified Nurse-Midwife 08/12/2024 3:53 PM

## 2024-08-13 ENCOUNTER — Inpatient Hospital Stay (HOSPITAL_COMMUNITY)
Admission: AD | Admit: 2024-08-13 | Discharge: 2024-08-13 | Disposition: A | Discharge status: Home / Self Care | Payer: Self-pay | Attending: Obstetrics and Gynecology | Admitting: Obstetrics and Gynecology

## 2024-08-13 ENCOUNTER — Other Ambulatory Visit: Payer: Self-pay

## 2024-08-13 DIAGNOSIS — N390 Urinary tract infection, site not specified: Secondary | ICD-10-CM | POA: Diagnosis not present

## 2024-08-13 DIAGNOSIS — Z603 Acculturation difficulty: Secondary | ICD-10-CM | POA: Diagnosis not present

## 2024-08-13 DIAGNOSIS — O2342 Unspecified infection of urinary tract in pregnancy, second trimester: Secondary | ICD-10-CM | POA: Diagnosis present

## 2024-08-13 DIAGNOSIS — O26892 Other specified pregnancy related conditions, second trimester: Secondary | ICD-10-CM

## 2024-08-13 DIAGNOSIS — Z3A24 24 weeks gestation of pregnancy: Secondary | ICD-10-CM | POA: Diagnosis not present

## 2024-08-13 DIAGNOSIS — Z758 Other problems related to medical facilities and other health care: Secondary | ICD-10-CM

## 2024-08-13 DIAGNOSIS — B9689 Other specified bacterial agents as the cause of diseases classified elsewhere: Secondary | ICD-10-CM

## 2024-08-13 DIAGNOSIS — B961 Klebsiella pneumoniae [K. pneumoniae] as the cause of diseases classified elsewhere: Secondary | ICD-10-CM | POA: Insufficient documentation

## 2024-08-13 DIAGNOSIS — Z1612 Extended spectrum beta lactamase (ESBL) resistance: Secondary | ICD-10-CM | POA: Diagnosis present

## 2024-08-13 MED ORDER — LIDOCAINE HCL (PF) 1 % IJ SOLN
5.0000 mL | Freq: Once | INTRAMUSCULAR | Status: AC
  Administered 2024-08-13: 5 mL
  Filled 2024-08-13: qty 5

## 2024-08-13 MED ORDER — ERTAPENEM SODIUM 1 G IJ SOLR
1.0000 g | Freq: Once | INTRAMUSCULAR | Status: AC
  Administered 2024-08-13: 1 g via INTRAMUSCULAR
  Filled 2024-08-13: qty 1000

## 2024-08-13 NOTE — MAU Note (Signed)
 Caitlin Marks is a 33 y.o. at [redacted]w[redacted]d here in MAU reporting: she's here to receive hte last injection.  Denies pain, VB or LOF.  Reports +FM.  LMP: 02/12/2024 Onset of complaint: ongoing Pain score: 0 Vitals:   08/13/24 1431  BP: 107/67  Pulse: (!) 102  Resp: 18  Temp: 98 F (36.7 C)  SpO2: 98%     FHT: 1610 bpm  Lab orders placed from triage: antibiotic injection

## 2024-08-13 NOTE — MAU Provider Note (Signed)
 Swahili interpreter [phone] used during entire encounter  S [name] is a 33 y.o. [phone] patient who presents to MAU today at 24 weeks 1 day for her seventh antibiotic injection of Ertapenem due to UTI with ESBL Klebsiella.(08/04/24) she offers no complaints today and denies any vaginal bleeding, leaking fluid, abdominal cramping, and does report good fetal movements  The remainder of the ROS is negative unless otherwise noted in HPI    O BP 107/67 (BP Location: Right Arm)   Pulse (!) 102   Temp 98 F (36.7 C) (Oral)   Resp 18   Ht 5' 1 (1.549 m)   Wt 74.8 kg   LMP 02/12/2024 (Approximate)   SpO2 98%   BMI 31.16 kg/m  Physical Exam Vitals and nursing note reviewed.  Constitutional:      General: She is not in acute distress.    Appearance: Normal appearance. She is not ill-appearing.  HENT:     Head: Normocephalic.     Nose: Nose normal.  Cardiovascular:     Rate and Rhythm: Normal rate.  Pulmonary:     Effort: Pulmonary effort is normal.  Abdominal:     Palpations: Abdomen is soft.  Musculoskeletal:        General: Normal range of motion.     Cervical back: Normal range of motion.  Skin:    General: Skin is warm.  Neurological:     Mental Status: She is alert and oriented to person, place, and time.  Psychiatric:        Mood and Affect: Mood normal.        Behavior: Behavior normal.    MDM  MODERATE   Orders Placed This Encounter  Procedures   Discharge patient Discharge disposition: 01-Home or Self Care; Discharge patient date: 08/13/2024    Standing Status:   Standing    Number of Occurrences:   1    Discharge disposition:   01-Home or Self Care [1]    Discharge patient date:   08/13/2024    Meds ordered this encounter  Medications   ertapenem [facility]) injection 1 g    My specialty / Organism:   ESBL organism    Antibiotic Indication::   ESBL Infection   lidocaine (PF) (XYLOCAINE) 1 % injection 5 mL      I have reviewed the patient  chart and performed the physical exam .Medications ordered as stated below.  A/P as described  above/below.  Counseling and education provided and patient agreeable  with plan as described below. Verbalized understanding.    ASSESSMENT Medical screening exam complete Urinary tract infection due to ESBL Klebsiella  [redacted] weeks gestation of pregnancy  Language barrier     PLAN Future Appointments  Date Time Provider Department Center  08/29/2024  3:00 PM Melvenia Corean SAILOR, NP [facility]  09/12/2024  8:15 AM [name], MD [facility]  09/12/2024  9:00 AM [facility] LAB [facility]  09/12/2024  2:15 PM [facility] PROVIDER 1 [facility]  09/12/2024  2:30 PM [facility] US5 [facility]  10/10/2024  2:15 PM [facility] PROVIDER 1 [facility]  10/10/2024  2:30 PM [facility] US5 [facility]  12/12/2024  2:10 PM [name], MD [facility]    Discharge from MAU in stable condition  Follow up in [facility] Office as scheduled  See AVS for full description of educational information and instructions provided to the patient at time of discharge  Warning signs for worsening condition that would  warrant emergency follow-up discussed Patient may return to MAU as needed   [name] LABOR, NP 08/13/2024 3:02 PM   This chart was dictated using voice recognition software, Dragon. Despite the best efforts of this provider to proofread and correct errors, errors may still occur which can change documentation meaning.

## 2024-08-19 ENCOUNTER — Other Ambulatory Visit: Payer: Self-pay

## 2024-08-19 ENCOUNTER — Other Ambulatory Visit: Payer: Self-pay | Admitting: Pharmacy Technician

## 2024-08-19 ENCOUNTER — Other Ambulatory Visit: Payer: Self-pay | Admitting: Infectious Diseases

## 2024-08-19 DIAGNOSIS — Z21 Asymptomatic human immunodeficiency virus [HIV] infection status: Secondary | ICD-10-CM

## 2024-08-19 NOTE — Progress Notes (Signed)
 Specialty Pharmacy Refill Coordination Note  Caitlin Marks is a 33 y.o. female contacted today regarding refills of specialty medication(s) Bictegravir-Emtricitab-Tenofov (Biktarvy )  Spoke to congretional nurse and with patient on the line   Patient requested Delivery   Delivery date: 08/24/24   Verified address: [address]  [place]   Medication will be filled on 08/23/24.  This fill date is pending response to refill request from provider. Patient is aware and if they have not received fill by intended date they must follow up with pharmacy.

## 2024-08-22 ENCOUNTER — Other Ambulatory Visit: Payer: Self-pay

## 2024-08-22 MED ORDER — BIKTARVY 50-200-25 MG PO TABS
1.0000 | ORAL_TABLET | Freq: Every day | ORAL | 0 refills | Status: DC
  Filled 2024-08-22: qty 30, 30d supply, fill #0

## 2024-08-29 ENCOUNTER — Ambulatory Visit: Admitting: Infectious Diseases

## 2024-09-08 ENCOUNTER — Other Ambulatory Visit: Payer: Self-pay

## 2024-09-08 DIAGNOSIS — O0990 Supervision of high risk pregnancy, unspecified, unspecified trimester: Secondary | ICD-10-CM

## 2024-09-12 ENCOUNTER — Ambulatory Visit

## 2024-09-12 ENCOUNTER — Encounter: Admitting: Obstetrics and Gynecology

## 2024-09-12 ENCOUNTER — Ambulatory Visit: Attending: Obstetrics and Gynecology | Admitting: Obstetrics

## 2024-09-12 ENCOUNTER — Other Ambulatory Visit

## 2024-09-12 VITALS — BP 121/81 | HR 108

## 2024-09-12 DIAGNOSIS — E669 Obesity, unspecified: Secondary | ICD-10-CM

## 2024-09-12 DIAGNOSIS — Z3A28 28 weeks gestation of pregnancy: Secondary | ICD-10-CM | POA: Insufficient documentation

## 2024-09-12 DIAGNOSIS — O99213 Obesity complicating pregnancy, third trimester: Secondary | ICD-10-CM | POA: Diagnosis not present

## 2024-09-12 DIAGNOSIS — O0990 Supervision of high risk pregnancy, unspecified, unspecified trimester: Secondary | ICD-10-CM | POA: Diagnosis not present

## 2024-09-12 DIAGNOSIS — O10013 Pre-existing essential hypertension complicating pregnancy, third trimester: Secondary | ICD-10-CM | POA: Diagnosis not present

## 2024-09-12 DIAGNOSIS — Z21 Asymptomatic human immunodeficiency virus [HIV] infection status: Secondary | ICD-10-CM

## 2024-09-12 DIAGNOSIS — O10913 Unspecified pre-existing hypertension complicating pregnancy, third trimester: Secondary | ICD-10-CM

## 2024-09-12 DIAGNOSIS — Z862 Personal history of diseases of the blood and blood-forming organs and certain disorders involving the immune mechanism: Secondary | ICD-10-CM | POA: Insufficient documentation

## 2024-09-12 DIAGNOSIS — O98713 Human immunodeficiency virus [HIV] disease complicating pregnancy, third trimester: Secondary | ICD-10-CM | POA: Insufficient documentation

## 2024-09-12 DIAGNOSIS — B20 Human immunodeficiency virus [HIV] disease: Secondary | ICD-10-CM

## 2024-09-12 DIAGNOSIS — I10 Essential (primary) hypertension: Secondary | ICD-10-CM

## 2024-09-12 NOTE — Progress Notes (Signed)
 MFM Consult Note  Caitlin Marks is currently at 28 weeks and 3 days.  She has been followed due to maternal HIV treated with Biktarvy  and chronic hypertension treated with Procardia .    She denies any problems since her last exam.  Her blood pressure today was 121/81.  She has not had an HIV viral load assessed in over 3 months.  Sonographic findings Single intrauterine pregnancy at 28w 3d.  Fetal cardiac activity:  Observed and appears normal. Presentation: Cephalic. Fetal biometry shows the estimated fetal weight of 2 pounds 7 ounces which measures at the 15th percentile. Amniotic fluid volume: Within normal limits.  AFI: 15.62 cm.  MVP: 5.29 cm. Placenta: Posterior.  There are limitations of prenatal ultrasound such as the inability to detect certain abnormalities due to poor visualization. Various factors such as fetal position, gestational age and maternal body habitus may increase the difficulty in visualizing the fetal anatomy.    Maternal HIV She should continue Biktarvy  as prescribed. She should have an HIV viral load assessed soon. We will make further management recommendations based on her most recent HIV viral load.  Chronic hypertension She should continue taking nifedipine  as prescribed. Due to chronic hypertension, we will start weekly fetal testing at 32 weeks.    She will return in 4 weeks for a growth scan and BPP.    The patient stated that all of her questions were answered today.    All conversations were held with the patient today with the help of a Swahili interpreter.  A total of 20 minutes was spent counseling and coordinating the care for this patient.  Greater than 50% of the time was spent in direct face-to-face contact.

## 2024-09-15 ENCOUNTER — Other Ambulatory Visit: Payer: Self-pay

## 2024-09-15 ENCOUNTER — Other Ambulatory Visit: Payer: Self-pay | Admitting: Infectious Diseases

## 2024-09-15 DIAGNOSIS — Z21 Asymptomatic human immunodeficiency virus [HIV] infection status: Secondary | ICD-10-CM

## 2024-09-15 NOTE — Telephone Encounter (Signed)
 Pending appt 11/13

## 2024-09-16 ENCOUNTER — Other Ambulatory Visit: Payer: Self-pay

## 2024-09-16 ENCOUNTER — Encounter: Payer: Self-pay | Admitting: Pharmacist

## 2024-09-16 MED ORDER — BICTEGRAVIR-EMTRICITAB-TENOFOV 50-200-25 MG PO TABS
1.0000 | ORAL_TABLET | Freq: Every day | ORAL | 0 refills | Status: DC
  Filled 2024-09-16: qty 30, 30d supply, fill #0

## 2024-09-16 NOTE — Congregational Nurse Program (Signed)
  Dept: [phone]   Congregational Nurse Program Note  Date of Encounter: 09/16/2024  Past Medical History: Past Medical History:  Diagnosis Date   HIV (human immunodeficiency virus infection) (HCC)    HIV (human immunodeficiency virus infection) (HCC) 02/26/2022    Encounter Details:  Community Questionnaire - 09/16/24 1445       Questionnaire   Ask client: Do you give verbal consent for me to treat you today? Yes    Student Assistance N/A   Ezra Person, EMT   Location Patient Served  [facility]    Encounter Setting Phone/Text/Email    Population Status Migrant/Refugee    Insurance Medicaid    Insurance/Financial Assistance Referral N/A    Medication Have Medication Insecurities;Patient Medications Reviewed;Provided Medication Assistance    Medical Provider Yes    Screening Referrals Made N/A    Medical Referrals Made N/A    Medical Appointment Completed [facility] PCP/Clinic;ED    CNP Interventions Advocate/Support;Navigate Healthcare System;Case Management;Counsel;Educate    Screenings CN Performed N/A    ED Visit Averted N/A    Life-Saving Intervention Made N/A         Specialty Pharmacy called me for medication refill. Attempted 3 way call with patient unsuccessfully. Medication refilled and will be delivered to her residence. I was later able to connect with parent  via internet WhatsApp call. She will be expecting medication delivery. Reviewed upcoming appointments with patient. All questions answered.  Naomie Bently Wyss RN BSN PCCN  [facility] [phone]-cell [phone]-office

## 2024-09-16 NOTE — Progress Notes (Signed)
 Specialty Pharmacy Refill Coordination Note  Caitlin Marks is a 33 y.o. female contacted today regarding refills of specialty medication(s) Bictegravir-Emtricitab-Tenofov (Biktarvy )   Patient requested Delivery   Delivery date: 09/20/24   Verified address: [address]  [place]    Medication will be filled on: 09/19/24  Messaged Cassie/Amanda on refill request.

## 2024-09-19 ENCOUNTER — Other Ambulatory Visit: Payer: Self-pay

## 2024-09-22 ENCOUNTER — Ambulatory Visit (INDEPENDENT_AMBULATORY_CARE_PROVIDER_SITE_OTHER): Admitting: Infectious Diseases

## 2024-09-22 ENCOUNTER — Other Ambulatory Visit: Payer: Self-pay

## 2024-09-22 ENCOUNTER — Encounter: Payer: Self-pay | Admitting: Infectious Diseases

## 2024-09-22 VITALS — BP 134/94 | HR 102 | Temp 97.5°F | Wt 163.0 lb

## 2024-09-22 DIAGNOSIS — Z23 Encounter for immunization: Secondary | ICD-10-CM | POA: Diagnosis not present

## 2024-09-22 DIAGNOSIS — Z21 Asymptomatic human immunodeficiency virus [HIV] infection status: Secondary | ICD-10-CM

## 2024-09-22 NOTE — Addendum Note (Signed)
 Addended by: FLORENE BOUCHARD D on: 09/22/2024 01:41 PM   Modules accepted: Orders

## 2024-09-22 NOTE — Patient Instructions (Signed)
 Continue the biktarvy  everyday

## 2024-09-22 NOTE — Progress Notes (Signed)
 Name: Caitlin Marks  DOB: 1991-01-24 MRN: [id number] PCP: Caitlin Barnie NOVAK, MD     Brief Narrative:  Caitlin Marks is a 33 y.o. female with HIV. Dx 2018. 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 Discussed the use of AI scribe software for clinical note transcription with the patient, who gave verbal consent to proceed.  History of Present Illness   Caitlin Marks is a 33 year old female with HIV and chronic hypertension who presents for routine follow-up HIV care.  She is currently 29 weeks and 6 days pregnant and is under the care of maternal fetal medicine and an OBGYN team. Her last viral load in July was undetectable, and she continues to take Biktarvy  daily without any issues obtaining her medication from the pharmacy. She states, 'I've been doing very good.'  She has a history of chronic hypertension and is currently prescribed nifedipine . She is due for a growth scan and biophysical profile assessment in early December. She has not yet received her flu shot this year but is open to receiving it during this visit.  Regarding her pregnancy, she is considering both breastfeeding and formula feeding.          07/14/2024    1:53 PM  Depression screen PHQ 2/9  Decreased Interest 0  Down, Depressed, Hopeless 0  PHQ - 2 Score 0  Altered sleeping 0  Tired, decreased energy 0  Change in appetite 0  Feeling bad or failure about yourself  0  Trouble concentrating 0  Moving slowly or fidgety/restless 0  Suicidal  thoughts 0  PHQ-9 Score 0      Data saved with a previous flowsheet row definition    Review of Systems  Constitutional:  Negative for chills and fever.  HENT:  Negative for tinnitus.   Eyes:  Negative for blurred vision and photophobia.  Respiratory:  Negative for cough and sputum production.   Cardiovascular:  Negative for chest pain.  Gastrointestinal:  Negative for diarrhea, nausea and vomiting.  Genitourinary:  Negative for dysuria.  Skin:  Negative for rash.  Neurological:  Negative for headaches.     Past Medical History:  Diagnosis Date   HIV (human immunodeficiency virus infection) (HCC)    HIV (human immunodeficiency virus infection) (HCC) 02/26/2022    Outpatient Medications Prior to Visit  Medication Sig Dispense Refill   bictegravir-emtricitabine -tenofovir  AF (BIKTARVY ) 50-200-25 MG TABS tablet Take 1 tablet by mouth daily. 30 tablet 0   Prenatal Vit-Fe Fumarate-FA (PRENATAL VITAMIN PO) Take by mouth.     No facility-administered medications prior to visit.     No Known Allergies  Social History   Tobacco Use   Smoking status: Never    Passive exposure: Past   Smokeless tobacco: Never  Vaping Use   Vaping status: Never Used  Substance Use Topics   Alcohol use: Not Currently   Drug use: Never    Family History  Problem Relation Age of Onset   Other Mother  Diabetes Neg Hx    Cancer Neg Hx     Social History   Substance and Sexual Activity  Sexual Activity Yes     Objective:   Vitals:   09/22/24 1254  BP: (!) 134/94  Pulse: (!) 102  Temp: (!) 97.5 F (36.4 C)  TempSrc: Temporal  SpO2: 97%  Weight: 163 lb (73.9 kg)   Body mass index is 30.8 kg/m.  Physical Exam Constitutional:      Appearance: Normal appearance. She is not ill-appearing.  HENT:     Mouth/Throat:     Mouth: Mucous membranes are moist.     Pharynx: Oropharynx is clear.  Eyes:     General: No scleral icterus. Cardiovascular:     Rate and Rhythm: Normal  rate.  Pulmonary:     Effort: Pulmonary effort is normal.  Neurological:     Mental Status: She is oriented to person, place, and time.  Psychiatric:        Mood and Affect: Mood normal.        Thought Content: Thought content normal.     Lab Results Lab Results  Component Value Date   WBC 5.9 08/04/2024   HGB 10.3 (L) 08/04/2024   HCT 30.4 (L) 08/04/2024   MCV 84.2 08/04/2024   PLT 188 08/04/2024    Lab Results  Component Value Date   CREATININE 0.73 08/04/2024   BUN <5 (L) 08/04/2024   NA 139 08/04/2024   K 3.8 08/04/2024   CL 110 08/04/2024   CO2 15 (L) 08/04/2024     Lab Results  Component Value Date   ALT 15 08/04/2024   AST 28 08/04/2024   ALKPHOS 67 08/04/2024   BILITOT 0.5 08/04/2024    Lab Results  Component Value Date   CHOL 144 04/15/2023   HDL 52 04/15/2023   LDLCALC 79 04/15/2023   TRIG 64 04/15/2023   CHOLHDL 2.8 04/15/2023   HIV 1 RNA Quant  Date Value  06/03/2024 50 copies/mL (H)  10/29/2023 <20 Copies/mL (H)  05/04/2023 <20 Copies/mL (H)   CD4 T Cell Abs (/uL)  Date Value  05/04/2023 1,187     Assessment & Plan:     Asymptomatic human immunodeficiency virus (HIV) infection in pregnancy - HIV infection is well-controlled with undetectable viral load as of July. She is 29 weeks and 6 days pregnant and continues on Biktarvy  with no issues obtaining medication. Transmission risk to the baby is essentially zero with consistent medication adherence. Discussed breastfeeding options, noting that with daily medication, the risk of transmission through breast milk is less than 1%. Emphasized the importance of daily medication adherence to maintain undetectable viral load and minimize transmission risk. We will discuss this again at our next visit. - Repeated viral load test today - Continue Biktarvy  daily - Administered flu shot today - Discussed breastfeeding options and risks - Will schedule follow-up appointment in 4 weeks for repeat viral  load to determine delivery plan   Chronic hypertension complicating pregnancy, third trimester - Chronic hypertension is managed with nifedipine . She is 29 weeks and 6 days pregnant and has been established with maternal fetal medicine and OBGYN teams. Due for a growth scan and biophysical profile assessment in early December. - Continue nifedipine  as prescribed - Ensure follow-up with maternal fetal medicine and OBGYN teams - Will schedule growth scan and biophysical profile assessment in early December      No orders of the defined types were placed in this encounter.  Orders  Placed This Encounter  Procedures   HIV 1 RNA quant-no reflex-bld   T-helper cells (CD4) count   Return in about 4 weeks (around 10/20/2024).   Corean Fireman, MSN, NP-C [facility] Pager: [phone] Office: [phone]  09/22/24  1:32 PM

## 2024-09-23 LAB — T-HELPER CELLS (CD4) COUNT (NOT AT ARMC)
CD4 % Helper T Cell: 51 % (ref 33–65)
CD4 T Cell Abs: 663 /uL (ref 400–1790)

## 2024-09-24 LAB — HIV-1 RNA QUANT-NO REFLEX-BLD
HIV 1 RNA Quant: <20 {copies}/mL — AB
HIV-1 RNA Quant, Log: <1.3 {Log_copies}/mL — AB

## 2024-10-04 ENCOUNTER — Telehealth: Payer: Self-pay | Admitting: Infectious Disease

## 2024-10-04 NOTE — Telephone Encounter (Signed)
 December appointment noted updated requesting Genosure to be drawn at that time.   Fabiano Ginley, BSN, RN

## 2024-10-04 NOTE — Telephone Encounter (Signed)
 Dr. Helen from [facility] seeing pregnant patient with HIV  They like to get genosure archives on all patients they have that are pregnant in whom they do not have Resistance data  She is requesting us  to draw a genosure at her appt in December  Will forward to Marlin and clinical team

## 2024-10-10 ENCOUNTER — Ambulatory Visit: Attending: Obstetrics | Admitting: Obstetrics

## 2024-10-10 ENCOUNTER — Other Ambulatory Visit: Payer: Self-pay | Admitting: Obstetrics and Gynecology

## 2024-10-10 ENCOUNTER — Ambulatory Visit

## 2024-10-10 VITALS — BP 119/74 | HR 110

## 2024-10-10 DIAGNOSIS — O26893 Other specified pregnancy related conditions, third trimester: Secondary | ICD-10-CM | POA: Insufficient documentation

## 2024-10-10 DIAGNOSIS — E669 Obesity, unspecified: Secondary | ICD-10-CM | POA: Diagnosis not present

## 2024-10-10 DIAGNOSIS — O0990 Supervision of high risk pregnancy, unspecified, unspecified trimester: Secondary | ICD-10-CM | POA: Diagnosis not present

## 2024-10-10 DIAGNOSIS — D573 Sickle-cell trait: Secondary | ICD-10-CM | POA: Diagnosis not present

## 2024-10-10 DIAGNOSIS — O10913 Unspecified pre-existing hypertension complicating pregnancy, third trimester: Secondary | ICD-10-CM

## 2024-10-10 DIAGNOSIS — I10 Essential (primary) hypertension: Secondary | ICD-10-CM

## 2024-10-10 DIAGNOSIS — O99213 Obesity complicating pregnancy, third trimester: Secondary | ICD-10-CM | POA: Insufficient documentation

## 2024-10-10 DIAGNOSIS — O10013 Pre-existing essential hypertension complicating pregnancy, third trimester: Secondary | ICD-10-CM | POA: Diagnosis not present

## 2024-10-10 DIAGNOSIS — O365930 Maternal care for other known or suspected poor fetal growth, third trimester, not applicable or unspecified: Secondary | ICD-10-CM

## 2024-10-10 DIAGNOSIS — O98713 Human immunodeficiency virus [HIV] disease complicating pregnancy, third trimester: Secondary | ICD-10-CM | POA: Insufficient documentation

## 2024-10-10 DIAGNOSIS — Z3A32 32 weeks gestation of pregnancy: Secondary | ICD-10-CM | POA: Diagnosis not present

## 2024-10-10 DIAGNOSIS — B20 Human immunodeficiency virus [HIV] disease: Secondary | ICD-10-CM

## 2024-10-10 NOTE — Progress Notes (Unsigned)
 MFM Consult Note  Caitlin Marks is currently at [redacted]w[redacted]d. She has been followed due to IUGR.  She denies any problems since her last exam and reports feeling fetal movements throughout the day.    Sonographic findings Single intrauterine pregnancy at 32w 3d. Fetal cardiac activity:  Observed and appears normal. Presentation: Cephalic. Fetal biometry shows the estimated fetal weight of 3 lb 10 oz, 1656 grams (7%).  Amniotic fluid: Within normal limits.  14.03  MVP: 5.45 cm. Placenta: Posterior. BPP 8/8.   Doppler studies of the umbilical arteries showed a normal S/D ratio of 3.27.  There were no signs of absent or reversed end-diastolic flow.    Due to fetal growth restriction, we will continue to follow her with weekly fetal testing and umbilical artery Doppler studies.    We will reassess the fetal growth again in 3 weeks.    She will return in 1 week for another BPP and umbilical artery Doppler study.   The patient stated that all of her questions were answered.   A total of 20 minutes was spent counseling and coordinating the care for this patient.  Greater than 50% of the time was spent in direct face-to-face contact.

## 2024-10-11 ENCOUNTER — Other Ambulatory Visit: Payer: Self-pay | Admitting: *Deleted

## 2024-10-11 DIAGNOSIS — O365930 Maternal care for other known or suspected poor fetal growth, third trimester, not applicable or unspecified: Secondary | ICD-10-CM

## 2024-10-12 ENCOUNTER — Other Ambulatory Visit: Payer: Self-pay | Admitting: Pharmacist

## 2024-10-12 ENCOUNTER — Other Ambulatory Visit (HOSPITAL_COMMUNITY): Payer: Self-pay

## 2024-10-12 ENCOUNTER — Other Ambulatory Visit: Payer: Self-pay

## 2024-10-12 MED ORDER — BIKTARVY 50-200-25 MG PO TABS
1.0000 | ORAL_TABLET | Freq: Every day | ORAL | 5 refills | Status: AC
  Filled 2024-10-12 – 2024-10-17 (×2): qty 30, 30d supply, fill #0
  Filled 2024-11-11: qty 30, 30d supply, fill #1
  Filled 2024-12-09: qty 30, 30d supply, fill #2

## 2024-10-17 ENCOUNTER — Other Ambulatory Visit: Payer: Self-pay

## 2024-10-17 ENCOUNTER — Other Ambulatory Visit (HOSPITAL_COMMUNITY): Payer: Self-pay

## 2024-10-17 NOTE — Progress Notes (Signed)
 Specialty Pharmacy Refill Coordination Note  Caitlin Marks is a 33 y.o. female contacted today regarding refills of specialty medication(s) Bictegravir-Emtricitab-Tenofov (Biktarvy )   Patient requested Delivery   Delivery date: 10/21/24   Verified address: [address]  [place]   Medication will be filled on: 10/20/24

## 2024-10-18 ENCOUNTER — Telehealth: Payer: Self-pay

## 2024-10-18 NOTE — Telephone Encounter (Signed)
 Medication refill by [facility] Specialty pharmacy completed. I assisted with interpretation via a 3 way call.Medication will be delivered to home.  Naomie Trinton Prewitt RN BSN PCCN  [facility] [phone]-cell [phone]-office

## 2024-10-19 ENCOUNTER — Encounter: Payer: Self-pay | Admitting: Obstetrics and Gynecology

## 2024-10-19 ENCOUNTER — Ambulatory Visit: Admitting: Obstetrics and Gynecology

## 2024-10-19 ENCOUNTER — Telehealth: Payer: Self-pay

## 2024-10-19 ENCOUNTER — Other Ambulatory Visit: Payer: Self-pay

## 2024-10-19 VITALS — BP 144/94 | HR 106 | Wt 164.4 lb

## 2024-10-19 DIAGNOSIS — N390 Urinary tract infection, site not specified: Secondary | ICD-10-CM | POA: Diagnosis not present

## 2024-10-19 DIAGNOSIS — O365930 Maternal care for other known or suspected poor fetal growth, third trimester, not applicable or unspecified: Secondary | ICD-10-CM

## 2024-10-19 DIAGNOSIS — Z3A33 33 weeks gestation of pregnancy: Secondary | ICD-10-CM

## 2024-10-19 DIAGNOSIS — O10913 Unspecified pre-existing hypertension complicating pregnancy, third trimester: Secondary | ICD-10-CM

## 2024-10-19 DIAGNOSIS — Z603 Acculturation difficulty: Secondary | ICD-10-CM | POA: Insufficient documentation

## 2024-10-19 DIAGNOSIS — B9689 Other specified bacterial agents as the cause of diseases classified elsewhere: Secondary | ICD-10-CM

## 2024-10-19 DIAGNOSIS — Z758 Other problems related to medical facilities and other health care: Secondary | ICD-10-CM

## 2024-10-19 DIAGNOSIS — O0933 Supervision of pregnancy with insufficient antenatal care, third trimester: Secondary | ICD-10-CM | POA: Insufficient documentation

## 2024-10-19 DIAGNOSIS — O10919 Unspecified pre-existing hypertension complicating pregnancy, unspecified trimester: Secondary | ICD-10-CM

## 2024-10-19 DIAGNOSIS — O98713 Human immunodeficiency virus [HIV] disease complicating pregnancy, third trimester: Secondary | ICD-10-CM

## 2024-10-19 DIAGNOSIS — O365990 Maternal care for other known or suspected poor fetal growth, unspecified trimester, not applicable or unspecified: Secondary | ICD-10-CM | POA: Insufficient documentation

## 2024-10-19 NOTE — Progress Notes (Signed)
 PRENATAL VISIT NOTE  Subjective:  Caitlin Marks is a 33 y.o. G3P2002 at [redacted]w[redacted]d being seen today for ongoing prenatal care.  She is currently monitored for the following issues for this high-risk pregnancy and has HIV (human immunodeficiency virus infection) (HCC); Primary hypertension; Supervision of high risk pregnancy, antepartum; Chronic hypertension affecting pregnancy; IUGR (intrauterine growth restriction) affecting care of mother; Language barrier; Insufficient prenatal care in third trimester; and Urinary tract infection due to ESBL Klebsiella on their problem list.  Patient reports no complaints.  Contractions: Not present. Vag. Bleeding: None.  Movement: Present. Denies leaking of fluid.   The following portions of the patient's history were reviewed and updated as appropriate: allergies, current medications, past family history, past medical history, past social history, past surgical history and problem list.   Objective:   Vitals:   10/19/24 1314 10/19/24 1336  BP: (!) 137/94 (!) 144/94  Pulse: (!) 103 (!) 106  Weight: 164 lb 6.4 oz (74.6 kg)     Fetal Status:  Fetal Heart Rate (bpm): 157   Movement: Present    General: Alert, oriented and cooperative. Patient is in no acute distress.  Skin: Skin is warm and dry. No rash noted.   Cardiovascular: Normal heart rate noted  Respiratory: Normal respiratory effort, no problems with respiration noted  Abdomen: Soft, gravid, appropriate for gestational age.  Pain/Pressure: Absent     Pelvic: Cervical exam deferred        Extremities: Normal range of motion.  Edema: None  Mental Status: Normal mood and affect. Normal behavior. Normal judgment and thought content.      07/14/2024    1:53 PM 02/22/2024    9:07 AM 10/29/2023   11:09 AM  Depression screen PHQ 2/9  Decreased Interest 0 0 0  Down, Depressed, Hopeless 0 0 0  PHQ - 2 Score 0 0 0  Altered sleeping 0 0   Tired, decreased energy 0 0   Change in appetite 0 0    Feeling bad or failure about yourself  0 0   Trouble concentrating 0 0   Moving slowly or fidgety/restless 0 0   Suicidal thoughts 0 0   PHQ-9 Score 0  0    Difficult doing work/chores  Not difficult at all      Data saved with a previous flowsheet row definition        07/14/2024    1:53 PM 02/22/2024    9:07 AM  GAD 7 : Generalized Anxiety Score  Nervous, Anxious, on Edge 0 0  Control/stop worrying 0 0  Worry too much - different things 0 0  Trouble relaxing 0 0  Restless 0 0  Easily annoyed or irritable 0 0  Afraid - awful might happen 0 0  Total GAD 7 Score 0 0  Anxiety Difficulty  Not difficult at all    Assessment and Plan:  Pregnancy: G3P2002 at [redacted]w[redacted]d 1. [redacted] weeks gestation of pregnancy (Primary) GTT, 28wk labs today. Can do GBS next visit. Declines rsv - Glucose tolerance, 1 hour - CBC - RPR  2. Insufficient prenatal care in third trimester Last OB visit at 23wks  3. HIV disease affecting pregnancy in third trimester Followed by ID and on biktarvy . 11/13 VL undetectable and cd4 wnl. Confirmed with patient her appt for tomorrow  4. Poor fetal growth affecting management of mother in third trimester, single or unspecified fetus Continue with weekly testing. Follow up mfm for delivery timing. Last u/s said maybe 38wks.  12/1: efw 7%, 2786g, ac 42%, UAD wnl, bpp 8/8  5. Chronic hypertension affecting pregnancy Patient states she's on a qday BP medicine and that she took it today. There is nothing in the [facility] and she's unsure what it's called. I asked her to continue to take it like normal but to bring the bottle with her to her mfm u/s appt on Friday as we'll have to go up on the dose.   Pre-eclampsia ROS negative but will check labs today  6. Language barrier In person interpreter  7. Urinary tract infection due to ESBL Klebsiella S/p multiple ertapenem  doses sept and oct this year; pt asymptomatic.   Preterm labor symptoms and general obstetric precautions  including but not limited to vaginal bleeding, contractions, leaking of fluid and fetal movement were reviewed in detail with the patient. Please refer to After Visit Summary for other counseling recommendations.   No follow-ups on file.  Future Appointments  Date Time Provider Department Center  10/20/2024  1:30 PM Melvenia Corean SAILOR, NP [facility]  10/21/2024 12:45 PM [facility] PROVIDER 1 [facility]  10/21/2024  1:00 PM [facility] US3 [facility]  11/14/2024  1:15 PM [facility] PROVIDER 1 [facility]  11/14/2024  1:30 PM [facility] US4 [facility]  12/12/2024  2:10 PM [name], MD [facility] [place] Caitlin Bebe Furry, MD

## 2024-10-19 NOTE — Telephone Encounter (Signed)
 I have called patient with appointment reminder. Transportation assistance will be provided.  Naomie Pascuala Klutts RN BSN PCCN  [facility] [phone]-cell [phone]-office

## 2024-10-20 ENCOUNTER — Ambulatory Visit: Payer: Self-pay | Admitting: Obstetrics and Gynecology

## 2024-10-20 ENCOUNTER — Encounter: Payer: Self-pay | Admitting: Infectious Diseases

## 2024-10-20 ENCOUNTER — Ambulatory Visit (INDEPENDENT_AMBULATORY_CARE_PROVIDER_SITE_OTHER): Admitting: Infectious Diseases

## 2024-10-20 ENCOUNTER — Other Ambulatory Visit: Payer: Self-pay

## 2024-10-20 VITALS — BP 130/80 | HR 113 | Temp 97.3°F | Ht 62.0 in | Wt 164.0 lb

## 2024-10-20 DIAGNOSIS — Z21 Asymptomatic human immunodeficiency virus [HIV] infection status: Secondary | ICD-10-CM | POA: Diagnosis present

## 2024-10-20 LAB — CBC
Hematocrit: 33.1 % — ABNORMAL LOW (ref 34.0–46.6)
Hemoglobin: 10.6 g/dL — ABNORMAL LOW (ref 11.1–15.9)
MCH: 27.2 pg (ref 26.6–33.0)
MCHC: 32 g/dL (ref 31.5–35.7)
MCV: 85 fL (ref 79–97)
Platelets: 270 x10E3/uL (ref 150–450)
RBC: 3.89 x10E6/uL (ref 3.77–5.28)
RDW: 16.4 % — ABNORMAL HIGH (ref 11.7–15.4)
WBC: 5.7 x10E3/uL (ref 3.4–10.8)

## 2024-10-20 LAB — COMPREHENSIVE METABOLIC PANEL WITH GFR
ALT: 14 IU/L (ref 0–32)
AST: 28 IU/L (ref 0–40)
Albumin: 3.7 g/dL — ABNORMAL LOW (ref 3.9–4.9)
Alkaline Phosphatase: 173 IU/L — ABNORMAL HIGH (ref 41–116)
BUN/Creatinine Ratio: 8 — ABNORMAL LOW (ref 9–23)
BUN: 7 mg/dL (ref 6–20)
Bilirubin Total: 0.5 mg/dL (ref 0.0–1.2)
CO2: 20 mmol/L (ref 20–29)
Calcium: 9 mg/dL (ref 8.7–10.2)
Chloride: 98 mmol/L (ref 96–106)
Creatinine, Ser: 0.93 mg/dL (ref 0.57–1.00)
Globulin, Total: 3.4 g/dL (ref 1.5–4.5)
Glucose: 114 mg/dL — ABNORMAL HIGH (ref 70–99)
Potassium: 3.9 mmol/L (ref 3.5–5.2)
Sodium: 133 mmol/L — ABNORMAL LOW (ref 134–144)
Total Protein: 7.1 g/dL (ref 6.0–8.5)
eGFR: 84 mL/min/1.73 (ref >59)

## 2024-10-20 LAB — GLUCOSE TOLERANCE, 1 HOUR: Glucose, 1Hr PP: 114 mg/dL (ref 70–199)

## 2024-10-20 LAB — SYPHILIS: RPR W/REFLEX TO RPR TITER AND TREPONEMAL ANTIBODIES, TRADITIONAL SCREENING AND DIAGNOSIS ALGORITHM: RPR Ser Ql: NONREACTIVE

## 2024-10-20 NOTE — Progress Notes (Unsigned)
 Name: Caitlin Marks  DOB: Apr 03, 1991 MRN: [id number] PCP: [name], MD     Brief Narrative:  Caitlin Marks is a 33 y.o. female with HIV. Dx 2018. 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 Discussed the use of AI scribe software for clinical note transcription with the patient, who gave verbal consent to proceed.  History of Present Illness   Caitlin Marks is a 33 year old female with HIV who presents for follow-up regarding her pregnancy and breastfeeding plan.  [name] and expecting to deliver in January. There was a discussion about breastfeeding guidelines for HIV-positive mothers, noting that if the viral load is undetectable, breastfeeding is possible with the baby receiving medications such as Zituvio or Viramune for six weeks. There is uncertainty about prophylaxis beyond this period.  She inquired about obtaining a car seat for her baby, indicating she does not currently have one. She mentioned not being enrolled in the [facility] program since November and may need assistance contacting them for support.          07/14/2024    1:53 PM  Depression screen PHQ 2/9  Decreased Interest 0  Down, Depressed, Hopeless 0  PHQ - 2 Score 0  Altered sleeping 0  Tired, decreased energy 0  Change in appetite 0  Feeling bad or failure about yourself  0  Trouble concentrating 0  Moving slowly or fidgety/restless 0  Suicidal thoughts 0  PHQ-9 Score 0      Data saved with a previous  flowsheet row definition    Review of Systems  Constitutional:  Negative for chills and fever.  HENT:  Negative for tinnitus.   Eyes:  Negative for blurred vision and photophobia.  Respiratory:  Negative for cough and sputum production.   Cardiovascular:  Negative for chest pain.  Gastrointestinal:  Negative for diarrhea, nausea and vomiting.  Genitourinary:  Negative for dysuria.  Skin:  Negative for rash.  Neurological:  Negative for headaches.     Past Medical History:  Diagnosis Date   HIV (human immunodeficiency virus infection) (HCC)    HIV (human immunodeficiency virus infection) (HCC) 02/26/2022    Outpatient Medications Prior to Visit  Medication Sig Dispense Refill   bictegravir-emtricitabine -tenofovir  AF (BIKTARVY ) 50-200-25 MG TABS tablet Take 1 tablet by mouth daily. 30 tablet 5   Prenatal Vit-Fe Fumarate-FA (PRENATAL VITAMIN PO) Take by mouth.     No facility-administered medications prior to visit.     No Known Allergies  Social History   Tobacco Use   Smoking status: Never    Passive exposure: Past   Smokeless tobacco: Never  Vaping Use   Vaping status: Never Used  Substance Use Topics   Alcohol use: Not Currently   Drug use: Never    Family History  Problem Relation Age of Onset   Other Mother    Diabetes Neg Hx    Cancer Neg Hx     Social History   Substance  and Sexual Activity  Sexual Activity Yes     Objective:   Vitals:   10/20/24 1310  BP: 130/80  Pulse: (!) 113  Temp: (!) 97.3 F (36.3 C)  TempSrc: Temporal  SpO2: 94%  Weight: 164 lb (74.4 kg)  Height: 5' 2 (1.575 m)   Body mass index is 30 kg/m.  Physical Exam Constitutional:      Appearance: Normal appearance. She is not ill-appearing.  HENT:     Mouth/Throat:     Mouth: Mucous membranes are moist.     Pharynx: Oropharynx is clear.  Eyes:     General: No scleral icterus. Cardiovascular:     Rate and Rhythm: Normal rate.  Pulmonary:     Effort: Pulmonary  effort is normal.  Neurological:     Mental Status: She is oriented to person, place, and time.  Psychiatric:        Mood and Affect: Mood normal.        Thought Content: Thought content normal.     Lab Results Lab Results  Component Value Date   WBC 5.7 10/19/2024   HGB 10.6 (L) 10/19/2024   HCT 33.1 (L) 10/19/2024   MCV 85 10/19/2024   PLT 270 10/19/2024    Lab Results  Component Value Date   CREATININE 0.93 10/19/2024   BUN 7 10/19/2024   NA 133 (L) 10/19/2024   K 3.9 10/19/2024   CL 98 10/19/2024   CO2 20 10/19/2024     Lab Results  Component Value Date   ALT 14 10/19/2024   AST 28 10/19/2024   ALKPHOS 173 (H) 10/19/2024   BILITOT 0.5 10/19/2024    Lab Results  Component Value Date   CHOL 144 04/15/2023   HDL 52 04/15/2023   LDLCALC 79 04/15/2023   TRIG 64 04/15/2023   CHOLHDL 2.8 04/15/2023   HIV 1 RNA Quant  Date Value  09/22/2024 <20 DETECTED copies/mL (A)  06/03/2024 50 copies/mL (H)  10/29/2023 <20 Copies/mL (H)   CD4 T Cell Abs (/uL)  Date Value  09/22/2024 663  05/04/2023 1,187     Assessment & Plan:     HIV infection in pregnancy - HIV infection is well-controlled with an undetectable viral load. She is pregnant and considering breastfeeding. Current guidelines suggest breastfeeding is safe if the mother is undetectable, but the baby requires prophylaxis. The specific duration and type of prophylaxis beyond six weeks are not clearly defined. There is a need to coordinate with pediatric infectious disease specialists to determine the best prophylactic regimen for the baby during breastfeeding. - Ordered viral load test to determine delivery plan. - Ordered Museum/gallery exhibitions officer as requested by pediatric ID team. - Will coordinate with pediatric ID team to determine prophylactic regimen for the baby during breastfeeding. - Discussed breastfeeding options and prophylaxis with her.      No orders of the defined types were placed in this  encounter.  Orders Placed This Encounter  Procedures   HIV 1 RNA quant-no reflex-bld   GenoSure Archive(SM)   Return in about 2 months (around 12/21/2024).   Corean Fireman, MSN, NP-C [facility] for Infectious Disease [facility] Pager: [phone] Office: [phone]  10/21/2024  10:12 AM

## 2024-10-20 NOTE — Patient Instructions (Addendum)
 Will see you back in 2 months to check in with your viral load and get a plan for breastfeeding together for you   [facility] Program for Car Seat -  Address: [address], [place], [place] [address] Phone: [phone]

## 2024-10-20 NOTE — Progress Notes (Unsigned)
 Patient: Caitlin Marks  DOB: 18-Jun-1991 MRN: [id number] PCP: [name], MD  Referring Provider:   Reason for Visit: Follow up on HIV management  Chief Complaint  Patient presents with   Follow-up    B20      Subjective   Subjective:   Caitlin Marks is a 33 year old female presenting to clinic today for follow up on her HIV management. Swahili interpreter in room. Since her last visit on 09/22/2024 Caitlin Marks reports no hospitalizations or ER visits. At the time of visit, Caitlin Marks is 33 weeks and 6 days pregnant and plans on having a vaginal delivery. Caitlin Marks reports doing both breast and bottle feeding. Caitlin Marks recently had an appointment with her high risk OB on 10/19/2024 and reports everything is fine. Caitlin Marks reports having chronic hypertension. There is no documented BP medication in the [facility] but reports taking a BP medication and does no remember the name. Caitlin Marks reports taking her medication today. Caitlin Marks current regimen is daily Biktarvy  and reports daily adherence with no missed doses or adverse effects. Caitlin Marks reports no barriers in obtaining medication from pharmacy. Last viral load was <20 and CD4 663. Caitlin Marks reports doing good on medication.Caitlin Marks denies fever, chills, night sweats, chest pain, shortness of breath, dysuria, lesions or rashes. Caitlin Marks has no other concerns at this time. Caitlin Marks has no other concerns at this time.   Review of Systems  Constitutional: Negative.   Respiratory: Negative.    Cardiovascular: Negative.   Gastrointestinal: Negative.   Genitourinary: Negative.   Musculoskeletal: Negative.   Skin: Negative.   Neurological: Negative.   Psychiatric/Behavioral: Negative.      Past Medical History:  Diagnosis Date   HIV (human immunodeficiency virus infection) (HCC)    HIV (human immunodeficiency virus infection) (HCC) 02/26/2022    Outpatient Medications Prior to Visit  Medication Sig Dispense Refill   bictegravir-emtricitabine -tenofovir  AF (BIKTARVY ) 50-200-25 MG TABS  tablet Take 1 tablet by mouth daily. 30 tablet 5   Prenatal Vit-Fe Fumarate-FA (PRENATAL VITAMIN PO) Take by mouth.     No facility-administered medications prior to visit.     Allergies[1]  Social History[2]  Family History  Problem Relation Age of Onset   Other Mother    Diabetes Neg Hx    Cancer Neg Hx        Objective   Objective:   Vitals:   10/20/24 1310  BP: 130/80  Pulse: (!) 113  Temp: (!) 97.3 F (36.3 C)  TempSrc: Temporal  SpO2: 94%  Weight: 164 lb (74.4 kg)  Height: 5' 2 (1.575 m)   Body mass index is 30 kg/m.  Physical Exam Constitutional:      Appearance: Normal appearance.  Cardiovascular:     Rate and Rhythm: Normal rate and regular rhythm.     Heart sounds: Normal heart sounds.  Pulmonary:     Effort: Pulmonary effort is normal.     Breath sounds: Normal breath sounds.  Abdominal:     Comments: Gravid  Musculoskeletal:        General: Normal range of motion.  Neurological:     Mental Status: Caitlin Marks is alert and oriented to person, place, and time.  Psychiatric:        Mood and Affect: Mood normal.        Behavior: Behavior normal.        Thought Content: Thought content normal.        Judgment: Judgment normal.       Assessment &  Plan:   Problem List Items Addressed This Visit       Other   HIV (human immunodeficiency virus infection) (HCC) - Primary   Relevant Orders   HIV 1 RNA quant-no reflex-bld   GenoSure Archive(SM)    Assessment and Plan  -HIV (human immunodeficiency virus infection) Currently regimen includes daily Biktarvy . Caitlin Marks reports daily adherence, no missed doses or adverse effects. Caitlin Marks reports no barriers in obtaining medication from pharmacy. Last viral load <20 and CD4 663. Today, I discussed the purpose of obtaining GenoSure Archive and how it will be used to tailor POC of the baby. Caitlin Marks verbalized understanding. Reinforced education on HIV and transmission to baby. Caitlin Marks verbalized understanding. Will order  HIV-1 RNA and GenoSure Archive today.      Orders Placed This Encounter  Procedures   HIV 1 RNA quant-no reflex-bld   GenoSure Archive(SM)    No orders of the defined types were placed in this encounter.   Return in about 2 months (around 12/21/2024).   Corean Fireman, MSN, NP-C [facility] for Infectious Disease [facility]  [email or web address] Pager: [phone] Office: [phone] [facility] Main Line: [phone] *Secure Chat Communication Welcome      [1] No Known Allergies [2]  Social History Tobacco Use   Smoking status: Never    Passive exposure: Past   Smokeless tobacco: Never  Vaping Use   Vaping status: Never Used  Substance Use Topics   Alcohol use: Not Currently   Drug use: Never

## 2024-10-21 ENCOUNTER — Telehealth: Payer: Self-pay

## 2024-10-21 ENCOUNTER — Other Ambulatory Visit: Attending: Obstetrics

## 2024-10-21 ENCOUNTER — Ambulatory Visit: Admitting: Obstetrics and Gynecology

## 2024-10-21 ENCOUNTER — Ambulatory Visit: Admitting: *Deleted

## 2024-10-21 ENCOUNTER — Other Ambulatory Visit: Payer: Self-pay | Admitting: Obstetrics and Gynecology

## 2024-10-21 VITALS — BP 123/72 | HR 116

## 2024-10-21 DIAGNOSIS — O365919 Maternal care for other known or suspected poor fetal growth, first trimester, other fetus: Secondary | ICD-10-CM | POA: Diagnosis not present

## 2024-10-21 DIAGNOSIS — O283 Abnormal ultrasonic finding on antenatal screening of mother: Secondary | ICD-10-CM

## 2024-10-21 DIAGNOSIS — O365930 Maternal care for other known or suspected poor fetal growth, third trimester, not applicable or unspecified: Secondary | ICD-10-CM | POA: Diagnosis not present

## 2024-10-21 DIAGNOSIS — O10919 Unspecified pre-existing hypertension complicating pregnancy, unspecified trimester: Secondary | ICD-10-CM | POA: Diagnosis not present

## 2024-10-21 DIAGNOSIS — Z3A34 34 weeks gestation of pregnancy: Secondary | ICD-10-CM | POA: Diagnosis not present

## 2024-10-21 DIAGNOSIS — B20 Human immunodeficiency virus [HIV] disease: Secondary | ICD-10-CM | POA: Diagnosis not present

## 2024-10-21 DIAGNOSIS — O0990 Supervision of high risk pregnancy, unspecified, unspecified trimester: Secondary | ICD-10-CM | POA: Diagnosis not present

## 2024-10-21 DIAGNOSIS — Z21 Asymptomatic human immunodeficiency virus [HIV] infection status: Secondary | ICD-10-CM | POA: Diagnosis not present

## 2024-10-21 DIAGNOSIS — O98713 Human immunodeficiency virus [HIV] disease complicating pregnancy, third trimester: Secondary | ICD-10-CM | POA: Diagnosis not present

## 2024-10-21 NOTE — Telephone Encounter (Signed)
 Patient called requesting transportation assistance. Ride scheduled.  Naomie Candise Crabtree RN BSN PCCN  [facility] [phone]-cell [phone]-office

## 2024-10-21 NOTE — Progress Notes (Signed)
 Maternal-Fetal Medicine Consultation  Name: Caitlin Marks  MRN: [id number]  GA: [id number] [redacted]w[redacted]d   Chronic hypertension.  I counseled the patient with the help of language interpreter ([employer] services). Patient brought her medications.  She takes nifedipine  XL 30 mg daily.  Blood pressure today at our office is 123/72 mmHg.  She does not have signs and symptoms of severe features of preeclampsia. Fetal growth restriction.  On ultrasound performed last week, the estimated fetal weight was at the 7th percentile. HIV complicating pregnancy.  Recent viral RNA levels are undetectable.  She takes antiviral medications regularly.  Ultrasound Normal amniotic fluid.  Cephalic presentation.  Umbilical artery Doppler showed normal forward diastolic flow.  Fetal breathing movements did not meet the criteria of BPP.  NST is reactive.  BPP 8/10.  I discussed the components of antenatal testing and that BPP score of 8/10 is associated with a stillbirth risk of less than 1 per 1,000 in one week. I discussed the importance of taking antihypertensives regularly to prevent maternal complications including stroke. Chronic hypertension fetal growth restriction increase the risk of perinatal mortality.  We discussed timing of delivery. I recommend delivery at [redacted] weeks gestation.  If blood pressures are not well-controlled or if severe fetal growth restriction on follow-up fetal growth assessment in 2 weeks, patient should be delivered at 36-weeks' gestation.  Recommendations -Continue weekly antenatal testing until delivery. - Fetal growth assessment in 2 weeks - Delivery at [redacted] weeks gestation. - If severe fetal growth restriction is seen, delivery at [redacted] weeks gestation is reasonable.     Consultation including face-to-face (more than 50%) counseling 25 minutes.

## 2024-10-21 NOTE — Procedures (Signed)
 Caitlin Marks 01-Dec-1990 [redacted]w[redacted]d  Fetus A Non-Stress Test Interpretation for 10/21/2024  Indication: Unsatisfactory BPP  Fetal Heart Rate A Mode: External Baseline Rate (A): 145 bpm Variability: Moderate Accelerations: 15 x 15 Decelerations: None Multiple birth?: No  Uterine Activity Mode: Palpation, Toco Contraction Frequency (min): 2 ucs Contraction Duration (sec): 50-60 Contraction Quality: Mild Resting Tone Palpated: Relaxed Resting Time: Adequate  Interpretation (Fetal Testing) Nonstress Test Interpretation: Reactive Overall Impression: Reassuring for gestational age Comments: Dr. Arna reviewed tracing

## 2024-10-22 LAB — HIV-1 RNA QUANT-NO REFLEX-BLD
HIV 1 RNA Quant: 27 {copies}/mL — ABNORMAL HIGH
HIV-1 RNA Quant, Log: 1.43 {Log_copies}/mL — ABNORMAL HIGH

## 2024-10-26 ENCOUNTER — Telehealth: Payer: Self-pay

## 2024-10-26 ENCOUNTER — Other Ambulatory Visit: Payer: Self-pay | Admitting: *Deleted

## 2024-10-26 DIAGNOSIS — O10919 Unspecified pre-existing hypertension complicating pregnancy, unspecified trimester: Secondary | ICD-10-CM

## 2024-10-26 DIAGNOSIS — O365930 Maternal care for other known or suspected poor fetal growth, third trimester, not applicable or unspecified: Secondary | ICD-10-CM

## 2024-10-26 NOTE — Telephone Encounter (Signed)
 I have called patient and informed that a car seat donation is available. She is very adult nurse. Transportation assistance will be provided for tomorrows appointment.  Naomie Yurem Viner RN BSN PCCN  [facility] [phone]-cell [phone]-office

## 2024-10-27 ENCOUNTER — Ambulatory Visit: Attending: Obstetrics and Gynecology

## 2024-10-27 ENCOUNTER — Other Ambulatory Visit: Payer: Self-pay | Admitting: Obstetrics

## 2024-10-27 ENCOUNTER — Ambulatory Visit

## 2024-10-27 ENCOUNTER — Ambulatory Visit: Admitting: Obstetrics and Gynecology

## 2024-10-27 VITALS — BP 125/75 | HR 108

## 2024-10-27 DIAGNOSIS — O98713 Human immunodeficiency virus [HIV] disease complicating pregnancy, third trimester: Secondary | ICD-10-CM | POA: Diagnosis not present

## 2024-10-27 DIAGNOSIS — Z3A34 34 weeks gestation of pregnancy: Secondary | ICD-10-CM | POA: Insufficient documentation

## 2024-10-27 DIAGNOSIS — E669 Obesity, unspecified: Secondary | ICD-10-CM | POA: Diagnosis not present

## 2024-10-27 DIAGNOSIS — B20 Human immunodeficiency virus [HIV] disease: Secondary | ICD-10-CM | POA: Diagnosis not present

## 2024-10-27 DIAGNOSIS — O10919 Unspecified pre-existing hypertension complicating pregnancy, unspecified trimester: Secondary | ICD-10-CM | POA: Insufficient documentation

## 2024-10-27 DIAGNOSIS — O99213 Obesity complicating pregnancy, third trimester: Secondary | ICD-10-CM

## 2024-10-27 DIAGNOSIS — O10013 Pre-existing essential hypertension complicating pregnancy, third trimester: Secondary | ICD-10-CM | POA: Diagnosis not present

## 2024-10-27 DIAGNOSIS — O365930 Maternal care for other known or suspected poor fetal growth, third trimester, not applicable or unspecified: Secondary | ICD-10-CM

## 2024-10-27 DIAGNOSIS — O99713 Diseases of the skin and subcutaneous tissue complicating pregnancy, third trimester: Secondary | ICD-10-CM

## 2024-10-27 DIAGNOSIS — O0990 Supervision of high risk pregnancy, unspecified, unspecified trimester: Secondary | ICD-10-CM

## 2024-10-27 DIAGNOSIS — Z21 Asymptomatic human immunodeficiency virus [HIV] infection status: Secondary | ICD-10-CM | POA: Diagnosis not present

## 2024-10-27 NOTE — Procedures (Signed)
 Caitlin Marks 04/18/1991 [redacted]w[redacted]d  Fetus A Non-Stress Test Interpretation for 10/27/2024  Indication: IUGR and Unsatisfactory BPP  Fetal Heart Rate A Mode: External Baseline Rate (A): 135 bpm Variability: Moderate Accelerations: 15 x 15 Decelerations: None Multiple birth?: No  Uterine Activity Mode: Palpation, Toco Contraction Frequency (min): none noted Resting Tone Palpated: Relaxed  Interpretation (Fetal Testing) Nonstress Test Interpretation: Reactive Comments: Reviewed with Dr. Arna

## 2024-10-27 NOTE — Progress Notes (Signed)
 Maternal-Fetal Medicine Consultation  Name: Caitlin Marks  MRN: [id number]  GA: [id number] [redacted]w[redacted]d   Patient was counseled with help of language interpreter ([employer] services). Patient takes nifedipine  XL 30 mg daily.    Chronic hypertension.  Blood pressure today at our office is 125/75 mmHg.  Fetal growth restriction.  On ultrasound performed last week, the estimated fetal weight was at the 7th percentile. HIV complicating pregnancy.  Recent viral RNA was less than 50 copies/mL.  Patient takes antiviral medications regularly.  Ultrasound Normal amniotic fluid.  Umbilical artery Doppler showed increased S/D ratio.  Fetal breathing movements did not meet the criteria of BPP.  NST is reactive.  BPP 8/10.  I discussed the significance of the findings including fetal breathing movements and Doppler studies. I discussed the viral RNA levels, which are less than 50 copies/mL.  Patient can have vaginal delivery.  Our recommendation is to deliver the patient at [redacted] weeks gestation.  If she has severe fetal growth restriction at follow-up scan, delivery at [redacted] weeks gestation is reasonable.  Recommendations -Planned fetal growth/BPP/NST/UA Doppler on 01/01/25.     Consultation including face-to-face (more than 50%) counseling 20 minutes.

## 2024-10-28 ENCOUNTER — Telehealth: Payer: Self-pay

## 2024-10-31 ENCOUNTER — Ambulatory Visit

## 2024-10-31 ENCOUNTER — Inpatient Hospital Stay (HOSPITAL_COMMUNITY)
Admission: AD | Admit: 2024-10-31 | Discharge: 2024-11-02 | DRG: 806 | Disposition: A | Discharge status: Home / Self Care | Attending: Obstetrics and Gynecology | Admitting: Obstetrics and Gynecology

## 2024-10-31 ENCOUNTER — Encounter (HOSPITAL_COMMUNITY): Payer: Self-pay

## 2024-10-31 ENCOUNTER — Encounter (HOSPITAL_COMMUNITY): Payer: Self-pay | Admitting: Obstetrics and Gynecology

## 2024-10-31 ENCOUNTER — Telehealth: Payer: Self-pay

## 2024-10-31 ENCOUNTER — Inpatient Hospital Stay (HOSPITAL_COMMUNITY): Admitting: Anesthesiology

## 2024-10-31 DIAGNOSIS — O9902 Anemia complicating childbirth: Secondary | ICD-10-CM | POA: Diagnosis not present

## 2024-10-31 DIAGNOSIS — I10 Essential (primary) hypertension: Secondary | ICD-10-CM | POA: Diagnosis present

## 2024-10-31 DIAGNOSIS — K219 Gastro-esophageal reflux disease without esophagitis: Secondary | ICD-10-CM | POA: Diagnosis not present

## 2024-10-31 DIAGNOSIS — O114 Pre-existing hypertension with pre-eclampsia, complicating childbirth: Secondary | ICD-10-CM | POA: Diagnosis not present

## 2024-10-31 DIAGNOSIS — O0933 Supervision of pregnancy with insufficient antenatal care, third trimester: Secondary | ICD-10-CM | POA: Diagnosis not present

## 2024-10-31 DIAGNOSIS — Z21 Asymptomatic human immunodeficiency virus [HIV] infection status: Secondary | ICD-10-CM | POA: Diagnosis not present

## 2024-10-31 DIAGNOSIS — O365990 Maternal care for other known or suspected poor fetal growth, unspecified trimester, not applicable or unspecified: Secondary | ICD-10-CM | POA: Diagnosis present

## 2024-10-31 DIAGNOSIS — O9872 Human immunodeficiency virus [HIV] disease complicating childbirth: Secondary | ICD-10-CM | POA: Diagnosis present

## 2024-10-31 DIAGNOSIS — Z603 Acculturation difficulty: Secondary | ICD-10-CM | POA: Diagnosis present

## 2024-10-31 DIAGNOSIS — Z3490 Encounter for supervision of normal pregnancy, unspecified, unspecified trimester: Principal | ICD-10-CM

## 2024-10-31 DIAGNOSIS — O1092 Unspecified pre-existing hypertension complicating childbirth: Secondary | ICD-10-CM | POA: Diagnosis not present

## 2024-10-31 DIAGNOSIS — O365930 Maternal care for other known or suspected poor fetal growth, third trimester, not applicable or unspecified: Secondary | ICD-10-CM | POA: Diagnosis not present

## 2024-10-31 DIAGNOSIS — O10919 Unspecified pre-existing hypertension complicating pregnancy, unspecified trimester: Secondary | ICD-10-CM | POA: Diagnosis present

## 2024-10-31 DIAGNOSIS — Z5982 Transportation insecurity: Secondary | ICD-10-CM | POA: Diagnosis not present

## 2024-10-31 DIAGNOSIS — O9962 Diseases of the digestive system complicating childbirth: Secondary | ICD-10-CM | POA: Diagnosis present

## 2024-10-31 DIAGNOSIS — O1404 Mild to moderate pre-eclampsia, complicating childbirth: Secondary | ICD-10-CM | POA: Diagnosis not present

## 2024-10-31 DIAGNOSIS — Z758 Other problems related to medical facilities and other health care: Secondary | ICD-10-CM | POA: Diagnosis present

## 2024-10-31 DIAGNOSIS — B20 Human immunodeficiency virus [HIV] disease: Secondary | ICD-10-CM | POA: Diagnosis not present

## 2024-10-31 DIAGNOSIS — O42013 Preterm premature rupture of membranes, onset of labor within 24 hours of rupture, third trimester: Secondary | ICD-10-CM | POA: Diagnosis not present

## 2024-10-31 DIAGNOSIS — Z3A35 35 weeks gestation of pregnancy: Secondary | ICD-10-CM

## 2024-10-31 LAB — CBC
HCT: 36.7 % (ref 36.0–46.0)
Hemoglobin: 12.7 g/dL (ref 12.0–15.0)
MCH: 27.3 pg (ref 26.0–34.0)
MCHC: 34.6 g/dL (ref 30.0–36.0)
MCV: 78.9 fL — ABNORMAL LOW (ref 80.0–100.0)
Platelets: 212 K/uL (ref 150–400)
RBC: 4.65 MIL/uL (ref 3.87–5.11)
RDW: 15.7 % — ABNORMAL HIGH (ref 11.5–15.5)
WBC: 9.6 K/uL (ref 4.0–10.5)
nRBC: 0 % (ref 0.0–0.2)

## 2024-10-31 LAB — CBC WITH DIFFERENTIAL/PLATELET
Abs Immature Granulocytes: 0.04 K/uL (ref 0.00–0.07)
Basophils Absolute: 0 K/uL (ref 0.0–0.1)
Basophils Relative: 0 %
Eosinophils Absolute: 0 K/uL (ref 0.0–0.5)
Eosinophils Relative: 0 %
HCT: 34.5 % — ABNORMAL LOW (ref 36.0–46.0)
Hemoglobin: 12.1 g/dL (ref 12.0–15.0)
Immature Granulocytes: 1 %
Lymphocytes Relative: 8 %
Lymphs Abs: 0.7 K/uL (ref 0.7–4.0)
MCH: 27.8 pg (ref 26.0–34.0)
MCHC: 35.1 g/dL (ref 30.0–36.0)
MCV: 79.1 fL — ABNORMAL LOW (ref 80.0–100.0)
Monocytes Absolute: 0.4 K/uL (ref 0.1–1.0)
Monocytes Relative: 5 %
Neutro Abs: 7.5 K/uL (ref 1.7–7.7)
Neutrophils Relative %: 86 %
Platelets: 181 K/uL (ref 150–400)
RBC: 4.36 MIL/uL (ref 3.87–5.11)
RDW: 15.8 % — ABNORMAL HIGH (ref 11.5–15.5)
WBC: 8.7 K/uL (ref 4.0–10.5)
nRBC: 0 % (ref 0.0–0.2)

## 2024-10-31 LAB — HIV ANTIBODY (ROUTINE TESTING W REFLEX): HIV Screen 4th Generation wRfx: REACTIVE — AB

## 2024-10-31 LAB — TYPE AND SCREEN
ABO/RH(D): B POS
Antibody Screen: NEGATIVE

## 2024-10-31 LAB — SYPHILIS: RPR W/REFLEX TO RPR TITER AND TREPONEMAL ANTIBODIES, TRADITIONAL SCREENING AND DIAGNOSIS ALGORITHM: RPR Ser Ql: NONREACTIVE

## 2024-10-31 MED ORDER — DIPHENHYDRAMINE HCL 50 MG/ML IJ SOLN: 12.5000 mg | INTRAMUSCULAR | Status: DC | PRN

## 2024-10-31 MED ORDER — LACTATED RINGERS AMNIOINFUSION
INTRAVENOUS | Status: DC
  Administered 2024-10-31: 300 mL via INTRAUTERINE
  Filled 2024-10-31 (×2): qty 1000

## 2024-10-31 MED ORDER — LACTATED RINGERS IV SOLN: INTRAVENOUS | Status: DC

## 2024-10-31 MED ORDER — HYDRALAZINE HCL 20 MG/ML IJ SOLN: 10.0000 mg | INTRAMUSCULAR | Status: DC | PRN

## 2024-10-31 MED ORDER — ZIDOVUDINE 10 MG/ML IV SOLN
2.0000 mg/kg | Freq: Once | INTRAVENOUS | Status: AC
  Administered 2024-10-31: 149 mg via INTRAVENOUS
  Filled 2024-10-31: qty 14.9

## 2024-10-31 MED ORDER — SOD CITRATE-CITRIC ACID 500-334 MG/5ML PO SOLN: 30.0000 mL | ORAL | Status: DC | PRN

## 2024-10-31 MED ORDER — ONDANSETRON HCL 4 MG/2ML IJ SOLN: 4.0000 mg | Freq: Four times a day (QID) | INTRAMUSCULAR | Status: DC | PRN

## 2024-10-31 MED ORDER — TETANUS-DIPHTH-ACELL PERTUSSIS 5-2-15.5 LF-MCG/0.5 IM SUSP: 0.5000 mL | Freq: Once | INTRAMUSCULAR | Status: DC

## 2024-10-31 MED ORDER — TERBUTALINE SULFATE 1 MG/ML IJ SOLN
INTRAMUSCULAR | Status: AC
  Administered 2024-10-31: 1 mg
  Filled 2024-10-31: qty 1

## 2024-10-31 MED ORDER — EPHEDRINE 5 MG/ML INJ: 10.0000 mg | INTRAVENOUS | Status: DC | PRN

## 2024-10-31 MED ORDER — ZOLPIDEM TARTRATE 5 MG PO TABS: 5.0000 mg | ORAL_TABLET | Freq: Every evening | ORAL | Status: DC | PRN

## 2024-10-31 MED ORDER — BICTEGRAVIR-EMTRICITAB-TENOFOV 50-200-25 MG PO TABS
1.0000 | ORAL_TABLET | Freq: Every day | ORAL | Status: DC
  Filled 2024-10-31: qty 1

## 2024-10-31 MED ORDER — PRENATAL MULTIVITAMIN CH
1.0000 | ORAL_TABLET | Freq: Every day | ORAL | Status: DC
  Administered 2024-10-31 – 2024-11-01 (×2): 1 via ORAL
  Filled 2024-10-31 (×2): qty 1

## 2024-10-31 MED ORDER — DIBUCAINE (PERIANAL) 1 % EX OINT: 1.0000 | TOPICAL_OINTMENT | CUTANEOUS | Status: DC | PRN

## 2024-10-31 MED ORDER — PHENYLEPHRINE 80 MCG/ML (10ML) SYRINGE FOR IV PUSH (FOR BLOOD PRESSURE SUPPORT)
80.0000 ug | PREFILLED_SYRINGE | INTRAVENOUS | Status: DC | PRN
  Filled 2024-10-31: qty 10

## 2024-10-31 MED ORDER — LABETALOL HCL 5 MG/ML IV SOLN
INTRAVENOUS | Status: AC
  Filled 2024-10-31: qty 4

## 2024-10-31 MED ORDER — TERBUTALINE SULFATE 1 MG/ML IJ SOLN
0.2500 mg | Freq: Once | INTRAMUSCULAR | Status: AC
  Administered 2024-10-31: 0.25 mg via SUBCUTANEOUS

## 2024-10-31 MED ORDER — POTASSIUM CHLORIDE CRYS ER 20 MEQ PO TBCR
20.0000 meq | EXTENDED_RELEASE_TABLET | Freq: Every day | ORAL | Status: DC
  Administered 2024-10-31 – 2024-11-02 (×3): 20 meq via ORAL
  Filled 2024-10-31 (×3): qty 1

## 2024-10-31 MED ORDER — HYDRALAZINE HCL 20 MG/ML IJ SOLN: 5.0000 mg | INTRAMUSCULAR | Status: DC | PRN

## 2024-10-31 MED ORDER — OXYCODONE-ACETAMINOPHEN 5-325 MG PO TABS: 1.0000 | ORAL_TABLET | ORAL | Status: DC | PRN

## 2024-10-31 MED ORDER — SIMETHICONE 80 MG PO CHEW: 80.0000 mg | CHEWABLE_TABLET | ORAL | Status: DC | PRN

## 2024-10-31 MED ORDER — DIPHENHYDRAMINE HCL 25 MG PO CAPS: 25.0000 mg | ORAL_CAPSULE | Freq: Four times a day (QID) | ORAL | Status: DC | PRN

## 2024-10-31 MED ORDER — LABETALOL HCL 5 MG/ML IV SOLN
20.0000 mg | INTRAVENOUS | Status: DC | PRN
  Administered 2024-10-31: 20 mg via INTRAVENOUS

## 2024-10-31 MED ORDER — OXYCODONE-ACETAMINOPHEN 5-325 MG PO TABS: 2.0000 | ORAL_TABLET | ORAL | Status: DC | PRN

## 2024-10-31 MED ORDER — COCONUT OIL OIL: 1.0000 | TOPICAL_OIL | Status: DC | PRN

## 2024-10-31 MED ORDER — LIDOCAINE HCL (PF) 1 % IJ SOLN: 30.0000 mL | INTRAMUSCULAR | Status: DC | PRN

## 2024-10-31 MED ORDER — LIDOCAINE HCL (PF) 1 % IJ SOLN
INTRAMUSCULAR | Status: DC | PRN
  Administered 2024-10-31 (×2): 5 mL via EPIDURAL

## 2024-10-31 MED ORDER — ACETAMINOPHEN 325 MG PO TABS
650.0000 mg | ORAL_TABLET | ORAL | Status: DC | PRN
  Administered 2024-11-01: 650 mg via ORAL
  Filled 2024-10-31: qty 2

## 2024-10-31 MED ORDER — NIFEDIPINE ER OSMOTIC RELEASE 30 MG PO TB24
30.0000 mg | ORAL_TABLET | Freq: Two times a day (BID) | ORAL | Status: DC
  Administered 2024-10-31 – 2024-11-01 (×4): 30 mg via ORAL
  Filled 2024-10-31 (×4): qty 1

## 2024-10-31 MED ORDER — ZIDOVUDINE 10 MG/ML IV SOLN
1.0000 mg/kg/h | INTRAVENOUS | Status: DC
  Administered 2024-10-31: 1 mg/kg/h via INTRAVENOUS
  Filled 2024-10-31 (×2): qty 40

## 2024-10-31 MED ORDER — LABETALOL HCL 5 MG/ML IV SOLN: 80.0000 mg | INTRAVENOUS | Status: DC | PRN

## 2024-10-31 MED ORDER — SODIUM CHLORIDE 0.9 % IV SOLN
5.0000 10*6.[IU] | Freq: Once | INTRAVENOUS | Status: AC
  Administered 2024-10-31: 5 10*6.[IU] via INTRAVENOUS
  Filled 2024-10-31: qty 5

## 2024-10-31 MED ORDER — BENZOCAINE-MENTHOL 20-0.5 % EX AERO
1.0000 | INHALATION_SPRAY | CUTANEOUS | Status: DC | PRN
  Filled 2024-10-31: qty 56

## 2024-10-31 MED ORDER — FUROSEMIDE 20 MG PO TABS
20.0000 mg | ORAL_TABLET | Freq: Every day | ORAL | Status: DC
  Administered 2024-10-31 – 2024-11-02 (×3): 20 mg via ORAL
  Filled 2024-10-31 (×3): qty 1

## 2024-10-31 MED ORDER — MAGNESIUM SULFATE 40 GM/1000ML IV SOLN
2.0000 g/h | INTRAVENOUS | Status: DC
  Administered 2024-11-01: 2 g/h via INTRAVENOUS
  Filled 2024-10-31: qty 1000

## 2024-10-31 MED ORDER — ONDANSETRON HCL 4 MG PO TABS: 4.0000 mg | ORAL_TABLET | ORAL | Status: DC | PRN

## 2024-10-31 MED ORDER — IBUPROFEN 600 MG PO TABS
600.0000 mg | ORAL_TABLET | Freq: Four times a day (QID) | ORAL | Status: DC
  Administered 2024-10-31 – 2024-11-02 (×8): 600 mg via ORAL
  Filled 2024-10-31 (×8): qty 1

## 2024-10-31 MED ORDER — LABETALOL HCL 5 MG/ML IV SOLN: 40.0000 mg | INTRAVENOUS | Status: DC | PRN

## 2024-10-31 MED ORDER — OXYTOCIN-SODIUM CHLORIDE 30-0.9 UT/500ML-% IV SOLN: 2.5000 [IU]/h | INTRAVENOUS | Status: DC

## 2024-10-31 MED ORDER — ACETAMINOPHEN 325 MG PO TABS: 650.0000 mg | ORAL_TABLET | ORAL | Status: DC | PRN

## 2024-10-31 MED ORDER — PHENYLEPHRINE 80 MCG/ML (10ML) SYRINGE FOR IV PUSH (FOR BLOOD PRESSURE SUPPORT)
80.0000 ug | PREFILLED_SYRINGE | INTRAVENOUS | Status: DC | PRN
  Administered 2024-10-31: 80 ug via INTRAVENOUS

## 2024-10-31 MED ORDER — SENNOSIDES-DOCUSATE SODIUM 8.6-50 MG PO TABS
2.0000 | ORAL_TABLET | ORAL | Status: DC
  Administered 2024-10-31 – 2024-11-01 (×2): 2 via ORAL
  Filled 2024-10-31 (×2): qty 2

## 2024-10-31 MED ORDER — OXYTOCIN BOLUS FROM INFUSION: 333.0000 mL | Freq: Once | INTRAVENOUS | Status: DC

## 2024-10-31 MED ORDER — MAGNESIUM SULFATE 40 GM/1000ML IV SOLN
INTRAVENOUS | Status: AC
  Filled 2024-10-31: qty 1000

## 2024-10-31 MED ORDER — ONDANSETRON HCL 4 MG/2ML IJ SOLN: 4.0000 mg | INTRAMUSCULAR | Status: DC | PRN

## 2024-10-31 MED ORDER — FENTANYL-BUPIVACAINE-NACL 0.5-0.125-0.9 MG/250ML-% EP SOLN
12.0000 mL/h | EPIDURAL | Status: DC | PRN
  Administered 2024-10-31: 12 mL/h via EPIDURAL
  Filled 2024-10-31: qty 250

## 2024-10-31 MED ORDER — LACTATED RINGERS IV SOLN: 500.0000 mL | Freq: Once | INTRAVENOUS | Status: DC

## 2024-10-31 MED ORDER — LABETALOL HCL 5 MG/ML IV SOLN: 20.0000 mg | INTRAVENOUS | Status: DC | PRN

## 2024-10-31 MED ORDER — WITCH HAZEL-GLYCERIN EX PADS: 1.0000 | MEDICATED_PAD | CUTANEOUS | Status: DC | PRN

## 2024-10-31 MED ORDER — LACTATED RINGERS IV SOLN: 500.0000 mL | INTRAVENOUS | Status: DC | PRN

## 2024-10-31 MED ORDER — MEDROXYPROGESTERONE ACETATE 150 MG/ML IM SUSP: 150.0000 mg | INTRAMUSCULAR | Status: DC | PRN

## 2024-10-31 MED ORDER — PENICILLIN G POT IN DEXTROSE 60000 UNIT/ML IV SOLN
3.0000 10*6.[IU] | INTRAVENOUS | Status: DC
  Filled 2024-10-31: qty 50

## 2024-10-31 MED ADMIN — Oxytocin-Sodium Chloride 0.9% IV Soln 30 Unit/500ML: 2.5 [IU]/h | INTRAVENOUS | NDC 99999070057

## 2024-10-31 MED ADMIN — Oxytocin Inj 10 Unit/ML: 333 mL | INTRAVENOUS | NDC 99999070057

## 2024-10-31 MED FILL — Oxytocin-Sodium Chloride 0.9% IV Soln 30 Unit/500ML: 2.5000 [IU]/h | INTRAVENOUS | Qty: 500 | Status: AC

## 2024-10-31 NOTE — Discharge Summary (Signed)
 "    Postpartum Discharge Summary  Date of Service updated-12/24     Patient Name: Caitlin Marks DOB: August 08, 1991 MRN: [id number]  Date of admission: 10/31/2024 Delivery date:10/31/2024 Delivering provider: DANNY GERALDS Date of discharge: 11/02/2024  Admitting diagnosis: Pregnancy [Z34.90] Preterm labor [O60.00] Intrauterine pregnancy: [redacted]w[redacted]d     Secondary diagnosis:  Principal Problem:   Pregnancy Active Problems:   HIV (human immunodeficiency virus infection) (HCC)   Primary hypertension   Chronic hypertension affecting pregnancy   IUGR (intrauterine growth restriction) affecting care of mother   Language barrier   Preterm labor  Additional problems: Chronic hypertension with superimposed preeclampsia    Discharge diagnosis: Term Pregnancy Delivered and CHTN with superimposed preeclampsia                                              Post partum procedures:None Augmentation: N/A Complications: None  Hospital course: Onset of Labor With Vaginal Delivery      33 y.o. yo [id number] at [redacted]w[redacted]d was admitted in Active Labor on 10/31/2024. Labor course was complicated by variable decels.  Membrane Rupture Time/Date: 7:38 AM,10/31/2024  Delivery Method:Vaginal, Spontaneous Operative Delivery:N/A Episiotomy: None Lacerations:  1st degree;Labial  Patient had a postpartum course complicated by chronic hypertension with superimposed preeclampsia.  She received IV magnesium  x 24 hours.  She was continued on her home regimen of Procardia  and Lasix .  Due to low normal BP Procardia  was changed to daily instead of twice daily.  She is ambulating, tolerating a regular diet, passing flatus, and urinating well. Patient is discharged home in stable condition on 11/02/2024.  Newborn Data: Birth date:10/31/2024 Birth time:8:31 AM Gender:Female Living status:Living Apgars:4 ,8  Weight:1860 g  Magnesium  Sulfate received: Yes: Seizure prophylaxis BMZ received:  No Rhophylac:N/A MMR:No T-DaP:Offered postpartum Flu: Yes RSV Vaccine received: No Transfusion:No  Immunizations received: Immunization History  Administered Date(s) Administered   Hepatitis B, PED/ADOLESCENT 03/19/2018, 05/13/2018, 12/16/2018   IPV 03/14/2024   Influenza, Seasonal, Injecte, Preservative Fre 03/14/2024, 09/22/2024   MMR 03/19/2018, 05/13/2018   OPV 03/14/2024   Pneumococcal Conjugate-13 03/19/2018   Td 03/19/2018, 05/26/2018, 02/10/2022   Tdap 02/10/2022    Physical exam  Vitals:   11/01/24 1600 11/01/24 2102 11/02/24 0003 11/02/24 0551  BP: 117/83 128/89 103/65 110/73  Pulse: (!) 101 97 89 86  Resp: 18 18 20 16   Temp: 98 F (36.7 C) 98.2 F (36.8 C) 98.2 F (36.8 C)   TempSrc: Oral Oral Oral   SpO2: 98%  100% 97%   General: alert, cooperative, and no distress Lochia: appropriate Uterine Fundus: firm Incision: N/A DVT Evaluation: No evidence of DVT seen on physical exam.  Labs: Lab Results  Component Value Date   WBC 7.6 11/01/2024   HGB 12.6 11/01/2024   HCT 36.7 11/01/2024   MCV 79.3 (L) 11/01/2024   PLT 218 11/01/2024      Latest Ref Rng & Units 11/01/2024    5:55 AM  CMP  Glucose 70 - 99 mg/dL 899   BUN 6 - 20 mg/dL <5   Creatinine 9.55 - 1.00 mg/dL 9.21   Sodium 864 - 854 mmol/L 129   Potassium 3.5 - 5.1 mmol/L 3.1   Chloride 98 - 111 mmol/L 94   CO2 22 - 32 mmol/L 22   Calcium 8.9 - 10.3 mg/dL 7.9   Total Protein 6.5 - 8.1 g/dL 7.2  Total Bilirubin 0.0 - 1.2 mg/dL 0.4   Alkaline Phos 38 - 126 U/L 196   AST 15 - 41 U/L 41   ALT 0 - 44 U/L 17    Edinburgh Score:    11/01/2024    8:30 AM  Edinburgh Postnatal Depression Scale Screening Tool  I have been able to laugh and see the funny side of things. 0  I have looked forward with enjoyment to things. 0  I have blamed myself unnecessarily when things went wrong. 0  I have been anxious or worried for no good reason. 0  I have felt scared or panicky for no good reason. 0   Things have been getting on top of me. 0  I have been so unhappy that I have had difficulty sleeping. 0  I have felt sad or miserable. 0  I have been so unhappy that I have been crying. 0  The thought of harming myself has occurred to me. 0  Edinburgh Postnatal Depression Scale Total 0   Edinburgh Postnatal Depression Scale Total: 0   After visit meds:  Allergies as of 11/02/2024   No Known Allergies      Medication List     TAKE these medications    acetaminophen  325 MG tablet Commonly known as: Tylenol  Take 2 tablets (650 mg total) by mouth every 4 (four) hours as needed (for pain scale < 4).   Biktarvy  50-200-25 MG Tabs tablet Generic drug: bictegravir-emtricitabine -tenofovir  AF Take 1 tablet by mouth daily.   furosemide  20 MG tablet Commonly known as: LASIX  Take 1 tablet (20 mg total) by mouth daily.   ibuprofen  600 MG tablet Commonly known as: ADVIL  Take 1 tablet (600 mg total) by mouth every 6 (six) hours.   NIFEdipine  30 MG 24 hr tablet Commonly known as: ADALAT  CC Take 1 tablet (30 mg total) by mouth daily. What changed: when to take this   potassium chloride  SA 20 MEQ tablet Commonly known as: KLOR-CON  M Take 1 tablet (20 mEq total) by mouth daily.   PRENATAL VITAMIN PO Take by mouth.         Discharge home in stable condition Infant Feeding: Breast Infant Disposition:NICU Discharge instruction: per After Visit Summary and Postpartum booklet. Activity: Advance as tolerated. Pelvic rest for 6 weeks.  Diet: routine diet Future Appointments: Future Appointments  Date Time Provider Department Center  11/08/2024  9:40 AM [facility] NURSE [facility]  12/01/2024 11:00 AM [facility]  12/12/2024  2:10 PM [name], MD [facility] [address]  12/12/2024  3:15 PM Herchel, Gloris LABOR, MD [facility]  12/21/2024  1:30 PM Melvenia Corean SAILOR, NP [facility]   Follow up Visit:  Follow-up Information     [facility] [place]. [facility] Follow up on 12/01/2024.   Specialty: Infectious Diseases Why: 11:00 am LAB ONLY appointment Contact information: [address] [place], [address] [place]   [address] [phone]        Melvenia Corean SAILOR, NP Follow up on 12/21/2024.   Specialty: Infectious Diseases Why: Post partum follow up with Corean at 1:30 PM Contact information: [address] [place] [address] [phone]         [facility] ID Team. Schedule an appointment as soon as possible for a visit in 2 week(s).   Why: Darold Miley Pothoven Dougherty [phone].        Center for [employer] [facility]. Go in 1 week(s).   Specialty: Obstetrics and Gynecology Why: Follow up in one week for a BP check Contact information: [address] [place]  [address] [phone]               Message sent 12/22  Please schedule this patient for a In person postpartum visit in 6 weeks with the following provider: Any provider. Additional Postpartum F/U:BP check 1 week  High risk pregnancy complicated by: HTN Delivery mode:  Vaginal, Spontaneous Anticipated Birth Control:  Unsure   11/02/2024 Adolfo Granieri M Brynnleigh Mcelwee, DO    "

## 2024-10-31 NOTE — Progress Notes (Addendum)
 LABOR PROGRESS NOTE Pt rechecked at 0530 Patient uncomfortable  SCE: still 6/70/-1. Leaking fluids  FHT: indeterminate baseline, mod variability, variable decels; overall category II. Toco: q1-67min  A/P:  Terb given Awaiting US  team for labs draw and 2nd IV for zidovudine  CBC collected from first site and PCN started   Barabara Maier, DO 7:32 AM

## 2024-10-31 NOTE — Progress Notes (Signed)
 LABOR PROGRESS NOTE Pt rechecked at 0700 Patient comfortable with epidural.  Labetalol  given for severe range bp, now having low-normal bp with decels to 60s Terb given SCE: 7/80/-1, bag feels softer AROM, IUPC placed  A/P:  Consented for CS by Dr. Abigail Capra higher amniotic leak given change in the bag and previous output Amnioinfusion Avoid FSE given HIV+   Barabara Maier, DO 7:44 AM

## 2024-10-31 NOTE — Telephone Encounter (Signed)
 Patient has called to inform me of current hospital admission and delivery of baby boy. Patient congratulated and reassured that I bring a donated car seat later today. She voiced concerns that her baby is in NICU. She would like to visit her baby. Emotional support offered.  Naomie Omar Gayden RN BSN PCCN  [facility] [phone]

## 2024-10-31 NOTE — MAU Note (Addendum)
..  Caitlin Marks is a 33 y.o. at [redacted]w[redacted]d here in MAU reporting: contractions back to back Attempting to get interpreter on the line but unable to find swahili interpreter. Support person brought to room and only understood limited amount of English.  Claris Cedar, CNM at bedside SVE:  bulging bag 90 0 station

## 2024-10-31 NOTE — H&P (Addendum)
 OBSTETRIC ADMISSION HISTORY AND PHYSICAL  Caitlin Marks is 33 y.o. [id number] with IUP at [redacted]w[redacted]d 12/02/2024, by Ultrasound presenting for PTL. She received her prenatal [facility] at [facility]  ROS (+) FM, ctx (-) VB, LOF. HA, visual changes, CP, SOB, RUQ pain, peripheral edema.  Prenatal History/Complications NURSING  PROVIDER  Conservator, [employer] Dating by LMP c/w U/S at 16 wks  [facility] Model Traditional Anatomy U/S wnl  Initiated care at  [employer]  Swahili               LAB RESULTS   Support Person   Genetics NIPS: LR M AFP: not done      NT/IT (FT only)        Carrier Screen Horizon: + SCT  Rhogam  --/--/B POS (09/25 1905) A1C/GTT Early HgbA1C: 5.0 Third trimester 2 hr GTT: neg  Flu Vaccine Declined      TDaP Vaccine   Blood Type --/--/B POS (09/25 1905)  RSV Vaccine   Antibody NEG (09/25 1905)  COVID Vaccine   Rubella 15.60 (07/29 1139)  Feeding Plan both RPR Non Reactive (07/29 1139)  Contraception Yes, Depo HBsAg Negative (07/29 1139)  Circumcision If boy, No HIV Preliminary Reactive (07/29 1139)  Pediatrician    HCVAb Non Reactive (07/29 1139)  Prenatal Classes        BTL Consent   Pap No results found for: DIAGPAP  BTL Pre-payment   GC/CT Initial:   36wks:    VBAC Consent   GBS For PCN allergy, check sensitivities   BRx Optimized? [ ]  yes   [ ]  no      DME Rx [ ]  BP cuff [ ]  Weight Scale Waterbirth  [ ]  Class [ ]  Consent [ ]  CNM visit  PHQ9 & GAD7 [  ] new OB [  ] 56 weeks  [  ] 36 weeks Induction  [ ]  Orders Entered [ ] Foley Y/N   OB History  Gravida Para Term Preterm AB Living  3 2 2  0 0 2  SAB IAB Ectopic Multiple Live Births  0 0 0  2    # Outcome Date GA Lbr Len/2nd Weight Sex Type Anes PTL Lv  3 Current           2 Term 12/23/16 [redacted]w[redacted]d   M Vag-Spont None  LIV  1 Term 12/04/08 [redacted]w[redacted]d   F Vag-Spont None  LIV   Patient Active Problem List   Diagnosis Date Noted   IUGR (intrauterine growth restriction) affecting care of mother  10/19/2024   Language barrier 10/19/2024   Insufficient prenatal care in third trimester 10/19/2024   Urinary tract infection due to ESBL Klebsiella 10/19/2024   Chronic hypertension affecting pregnancy 08/11/2024   Supervision of high risk pregnancy, antepartum 06/07/2024   Primary hypertension 04/15/2023   HIV (human immunodeficiency virus infection) (HCC) 02/26/2022   Medications Prior to Admission  Medication Sig Dispense Refill Last Dose/Taking   bictegravir-emtricitabine -tenofovir  AF (BIKTARVY ) 50-200-25 MG TABS tablet Take 1 tablet by mouth daily. 30 tablet 5    NIFEdipine  (PROCARDIA -XL/NIFEDICAL-XL) 30 MG 24 hr tablet Take 30 mg by mouth 2 (two) times daily.      Prenatal Vit-Fe Fumarate-FA (PRENATAL VITAMIN PO) Take by mouth.       Past Medical History: Past Medical History:  Diagnosis Date   HIV (human immunodeficiency virus infection) (HCC)    HIV (  human immunodeficiency virus infection) (HCC) 02/26/2022   Past Surgical History: Past Surgical History:  Procedure Laterality Date   NO PAST SURGERIES     Social History Social History   Socioeconomic History   Marital status: Single    Spouse name: Not on file   Number of children: Not on file   Years of education: Not on file   Highest education level: Not on file  Occupational History   Not on file  Tobacco Use   Smoking status: Never    Passive exposure: Past   Smokeless tobacco: Never  Vaping Use   Vaping status: Never Used  Substance and Sexual Activity   Alcohol use: Not Currently   Drug use: Never   Sexual activity: Yes  Other Topics Concern   Not on file  Social History Narrative   ** Merged History Encounter **       Social Drivers of Health   Tobacco Use: Low Risk (10/27/2024)   Patient History    Smoking Tobacco Use: Never    Smokeless Tobacco Use: Never    Passive Exposure: Past  Financial Resource Strain: Not on file  Food Insecurity: Food Insecurity Present (07/14/2024)   Epic     Worried About Programme Researcher, Broadcasting/film/video in the Last Year: Sometimes true    Barista in the Last Year: Often true  Transportation Needs: Unmet Transportation Needs (07/14/2024)   Epic    Lack of Transportation (Medical): Yes    Lack of Transportation (Non-Medical): Yes  Physical Activity: Not on file  Stress: Not on file  Social Connections: Not on file  Depression (PHQ2-9): Low Risk (07/14/2024)   Depression (PHQ2-9)    PHQ-2 Score: 0  Alcohol Screen: Not on file  Housing: Not on file  Utilities: Not on file  Health Literacy: Not on file   Family History: Family History  Problem Relation Age of Onset   Other Mother    Diabetes Neg Hx    Cancer Neg Hx     Review of Systems  All systems reviewed and negative except as stated in HPI   PHYSICAL EXAM Blood pressure (!) 142/28, pulse 93, temperature (!) 97.5 F (36.4 C), temperature source Oral, resp. rate 18, last menstrual period 02/12/2024. General appearance: alert, cooperative, and mild distress Lungs: respirations nonlabored Heart: regular rate Abdomen: gravid  SCE: 6/70/-1, suspect high leak. Bag feels intact on exam, but grossly leakage visible. Presentation: cephalic Variable decels to 90s   Prenatal labs: ABO, Rh: --/--/B POS (09/25 1905) Antibody: NEG (09/25 1905) Rubella: 15.60 (07/29 1139) RPR: Non Reactive (12/10 1441)  HBsAg: Negative (07/29 1139)  HIV: Preliminary Reactive (07/29 1139)   No results found for: GBS  Anatomy US : normal  Immunization History  Administered Date(s) Administered   Hepatitis B, PED/ADOLESCENT 03/19/2018, 05/13/2018, 12/16/2018   IPV 03/14/2024   Influenza, Seasonal, Injecte, Preservative Fre 03/14/2024, 09/22/2024   MMR 03/19/2018, 05/13/2018   OPV 03/14/2024   Pneumococcal Conjugate-13 03/19/2018   Td 03/19/2018, 05/26/2018, 02/10/2022   Tdap 02/10/2022    No results found for this or any previous visit (from the past 24 hours).  Patient Active Problem List    Diagnosis Date Noted   IUGR (intrauterine growth restriction) affecting care of mother 10/19/2024   Language barrier 10/19/2024   Insufficient prenatal care in third trimester 10/19/2024   Urinary tract infection due to ESBL Klebsiella 10/19/2024   Chronic hypertension affecting pregnancy 08/11/2024   Supervision of high risk pregnancy, antepartum  06/07/2024   Primary hypertension 04/15/2023   HIV (human immunodeficiency virus infection) (HCC) 02/26/2022    Prenatal Transfer Tool  Maternal Diabetes: Unknown, no GTT Genetic Screening: Normal. Beta thal carrier Maternal Ultrasounds/Referrals: Normal Fetal Ultrasounds or other Referrals:  None Maternal Substance Abuse:  No Significant Maternal Medications:  Meds include: Other:  Biktarvy  Significant Maternal Lab Results: HIV positive Number of Prenatal Visits:Less than or equal to 3 verified prenatal visits Maternal Vaccinations:Flu Other Comments:  None   ASSESSMENT & PLAN Kaylynn Darlisa Spruiell is 33 y.o. [id number] with IUP at [redacted]w[redacted]d 12/02/2024, by Ultrasound admitted for preterm labor.  Sono at 32+3: normal anatomy, cephalic presentation, posterior placenta, EFW 1656g (7%). EFW at 39w= 2786g  #Labor: spontaneous PTL #Pain: Per patient preference, encourage ambulation #FWB: Cat II   #HIV Established with [facility] for Infectious Disease - continue Biktarvy  - desires breastfeeding. S/p [facility] ID consult, encourage exclusive breastfeeding for 46mo, HIV transmission risk <1% with continued Biktarvy  use. Recommend zidovudine  during labor - last viral load, 10/20/2024= 27 - viral load on admit pending - zidovudine  2mg /kg loading, then 1mg /kg/hr through delivery  #FGR - EFW 7% as documented above - increased UAD on 12/18 34+6  #cHTN - continue nifedipine  30mg  BID - Lasix  + K x5d after delivery  #transportation insecurity Multiple SDOH needs - SW consult PP   #GBS status:  unknown #Feeding: Breastmilk   #Reproductive Life planning: Undecided #Circ:  yes  [employer] Interpreter used   Barabara Maier, DO FM-OB [facility] [employer]

## 2024-10-31 NOTE — Plan of Care (Signed)
  Problem: Education: Goal: Knowledge of General Education information will improve Description: Including pain rating scale, medication(s)/side effects and non-pharmacologic comfort measures Outcome: Progressing   Problem: Health Behavior/Discharge Planning: Goal: Ability to manage health-related needs will improve Outcome: Progressing   Problem: Clinical Measurements: Goal: Ability to maintain clinical measurements within normal limits will improve Outcome: Progressing Goal: Will remain free from infection Outcome: Progressing Goal: Diagnostic test results will improve Outcome: Progressing Goal: Respiratory complications will improve Outcome: Progressing Goal: Cardiovascular complication will be avoided Outcome: Progressing   Problem: Activity: Goal: Risk for activity intolerance will decrease Outcome: Progressing   Problem: Nutrition: Goal: Adequate nutrition will be maintained Outcome: Progressing   Problem: Coping: Goal: Level of anxiety will decrease Outcome: Progressing   Problem: Elimination: Goal: Will not experience complications related to bowel motility Outcome: Progressing Goal: Will not experience complications related to urinary retention Outcome: Progressing   Problem: Pain Managment: Goal: General experience of comfort will improve and/or be controlled Outcome: Progressing   Problem: Safety: Goal: Ability to remain free from injury will improve Outcome: Progressing   Problem: Skin Integrity: Goal: Risk for impaired skin integrity will decrease Outcome: Progressing   Problem: Education: Goal: Knowledge of Childbirth will improve Outcome: Progressing Goal: Ability to make informed decisions regarding treatment and plan of care will improve Outcome: Progressing Goal: Ability to state and carry out methods to decrease the pain will improve Outcome: Progressing Goal: Individualized Educational Video(s) Outcome: Progressing   Problem:  Coping: Goal: Ability to verbalize concerns and feelings about labor and delivery will improve Outcome: Progressing   Problem: Life Cycle: Goal: Ability to make normal progression through stages of labor will improve Outcome: Progressing Goal: Ability to effectively push during vaginal delivery will improve Outcome: Progressing   Problem: Role Relationship: Goal: Will demonstrate positive interactions with the child Outcome: Progressing   Problem: Safety: Goal: Risk of complications during labor and delivery will decrease Outcome: Progressing   Problem: Pain Management: Goal: Relief or control of pain from uterine contractions will improve Outcome: Progressing   Problem: Education: Goal: Knowledge of disease or condition will improve Outcome: Progressing Goal: Knowledge of the prescribed therapeutic regimen will improve Outcome: Progressing   Problem: Fluid Volume: Goal: Peripheral tissue perfusion will improve Outcome: Progressing   Problem: Clinical Measurements: Goal: Complications related to disease process, condition or treatment will be avoided or minimized Outcome: Progressing   Problem: Education: Goal: Knowledge of condition will improve Outcome: Progressing Goal: Individualized Educational Video(s) Outcome: Progressing Goal: Individualized Newborn Educational Video(s) Outcome: Progressing   Problem: Activity: Goal: Will verbalize the importance of balancing activity with adequate rest periods Outcome: Progressing Goal: Ability to tolerate increased activity will improve Outcome: Progressing   Problem: Coping: Goal: Ability to identify and utilize available resources and services will improve Outcome: Progressing   Problem: Life Cycle: Goal: Chance of risk for complications during the postpartum period will decrease Outcome: Progressing   Problem: Role Relationship: Goal: Ability to demonstrate positive interaction with newborn will  improve Outcome: Progressing   Problem: Skin Integrity: Goal: Demonstration of wound healing without infection will improve Outcome: Progressing

## 2024-10-31 NOTE — MAU Provider Note (Signed)
"   Event Date/Time   First Provider Initiated Contact with Patient 10/31/24 [phone]       S: [name] is a 33 y.o. G3P2002 at [redacted]w[redacted]d  who presents to MAU today complaining contractions back to back. She denies vaginal bleeding. She denies LOF. She reports normal fetal movement.    O: LMP 02/12/2024 (Approximate)  GENERAL: Well-developed, well-nourished female in no acute distress.  HEAD: Normocephalic, atraumatic.  CHEST: Normal effort of breathing, regular heart rate ABDOMEN: Soft, nontender, gravid  Cervical exam:  Dilation: 6 Effacement (%): 90 Station: 0   Fetal Monitoring: Baseline: 115 Variability: moderate  Accelerations: present Decelerations: absent Contractions: every 2-3 mins    A: SIUP at [redacted]w[redacted]d  Active labor  P: Admit to labor.  - Labor team notified.   Emilio Delilah HERO, CNM 10/31/2024 4:42 AM  "

## 2024-10-31 NOTE — Progress Notes (Signed)
 In to evaluate patient due to prolonged FHR deceleration. Terb given Maternal repositioning, bolus running. Maternal BP low, ephedrine  given with significant rebound of BP. Reviewed with patient the FHR decelerations and possible need for c-section. FHR is now improving. Will place IUPC and do amnioinfusion for variables.   Patient consented for possible cesarean section as needed with the use of [employer] Swahili interpreter [phone]  Cesarean section consent:   The risks, benefits and alternatives of the procedure were reviewed in detail. The risks of surgery were discussed with the patient including but were not limited to:    -- Bleeding with associated risks of blood transfusion  -- Infection which may require antibiotics -- Injury to internal organ -- Need for additional procedures including hysterectomy -- Formation of adhesions; placental abnormalities wth subsequent pregnancies --Thromboembolic phenomenon such as MI, CVA or death and other postoperative/anesthesia complications.     The patient concurred with the proposed plan, giving informed written consent for the procedure.     Rollo ONEIDA Bring, MD, FACOG Obstetrician & Gynecologist, [facility] for [facility], [facility]

## 2024-10-31 NOTE — Anesthesia Preprocedure Evaluation (Addendum)
"                                    Anesthesia Evaluation  Patient identified by MRN, date of birth, ID band Patient awake    Reviewed: Allergy & Precautions, Patient's Chart, lab work & pertinent test results  Airway Mallampati: II  TM Distance: >3 FB     Dental no notable dental hx.    Pulmonary neg pulmonary ROS   Pulmonary exam normal        Cardiovascular hypertension, Pt. on medications Normal cardiovascular exam Rhythm:Regular     Neuro/Psych negative neurological ROS  negative psych ROS   GI/Hepatic ,GERD  ,,  Endo/Other  negative endocrine ROS    Renal/GU negative Renal ROS  negative genitourinary   Musculoskeletal negative musculoskeletal ROS (+)    Abdominal   Peds  Hematology  (+) Blood dyscrasia, anemia , HIV  Anesthesia Other Findings   Reproductive/Obstetrics (+) Pregnancy                              Anesthesia Physical Anesthesia Plan  ASA: 2  Anesthesia Plan: Epidural   Post-op Pain Management:    Induction:   PONV Risk Score and Plan: Treatment may vary due to age or medical condition  Airway Management Planned: Natural Airway  Additional Equipment: Fetal Monitoring and None  Intra-op Plan:   Post-operative Plan:   Informed Consent: I have reviewed the patients History and Physical, chart, labs and discussed the procedure including the risks, benefits and alternatives for the proposed anesthesia with the patient or authorized representative who has indicated his/her understanding and acceptance.       Plan Discussed with: Anesthesiologist  Anesthesia Plan Comments:          Anesthesia Quick Evaluation  "

## 2024-10-31 NOTE — Lactation Note (Addendum)
 This note was copied from a baby's chart.  NICU Lactation Consultation Note  Patient Name: Caitlin Marks Date: 10/31/2024 Age:33 years  Reason for consult: Initial assessment; NICU baby; Late-preterm 34-36.6wks; Infant < 5lbs; Other (Comment) (Maternal CHTN, +HIV, AMA)  SUBJECTIVE  LC in to visit with P3 Mom of LPT delivered vaginally and SGA infant admitted to the NICU. LC in to consult with Mom and family member present to help translate.  Ipad interpreter is not available presently.  Mom choosing to breastfeed and LC explained the importance of pumping to support a full milk supply. LC assisted Mom to pump for the first time on initiation setting.  Mom wasn't able to express any colostrum at present.  Encouraged to pump every 3 hrs.   LC set up a washing and a drying basin.    OBJECTIVE Infant data: Mother's Current Feeding Choice: Breast Milk and Formula  O2 Device: Room Air  Infant feeding assessment IDFS - Readiness: 2   Maternal data: [id number] Vaginal, Spontaneous Has patient been taught Hand Expression?: Yes Hand Expression Comments: no colostrum presently Pumping frequency: initiated pumping at 4 hrs post partum Pumped volume: 0 mL Flange Size: 21  WIC Program: Yes WIC Referral Sent?: Yes What county?: [place]  ASSESSMENT Infant:  Feeding Status: Scheduled 8-11-2-5 Feeding method: Tube/Gavage (Bolus)  Maternal: Milk volume: Normal  INTERVENTIONS/PLAN Interventions: Interventions: DEBP; LC Services brochure Tools: Pump; Flanges Pump Education: Setup, frequency, and cleaning; Milk Storage  Plan: Consult Status: NICU follow-up NICU Follow-up type: New admission follow up   Claudene Aleck BRAVO 10/31/2024, 1:11 PM

## 2024-10-31 NOTE — Anesthesia Procedure Notes (Signed)
 Epidural Patient location during procedure: OB Start time: 10/31/2024 6:49 AM End time: 10/31/2024 6:59 AM  Staffing Anesthesiologist: Jerrye Sharper, MD Performed: anesthesiologist   Preanesthetic Checklist Completed: patient identified, IV checked, site marked, risks and benefits discussed, surgical consent, monitors and equipment checked, pre-op evaluation and timeout performed  Epidural Patient position: sitting Prep: DuraPrep and site prepped and draped Patient monitoring: continuous pulse ox and blood pressure Approach: midline Location: L3-L4 Injection technique: LOR air  Needle:  Needle type: Tuohy  Needle gauge: 17 G Needle length: 9 cm and 9 Needle insertion depth: 5 cm Catheter type: closed end flexible Catheter size: 19 Gauge Catheter at skin depth: 10 cm Test dose: negative and Other  Assessment Events: blood not aspirated, no cerebrospinal fluid, injection not painful, no injection resistance, no paresthesia and negative IV test  Additional Notes Patient identified. Risks and benefits discussed including failed block, incomplete  Pain control, post dural puncture headache, nerve damage, paralysis, blood pressure Changes, nausea, vomiting, reactions to medications-both toxic and allergic and post Partum back pain. All questions were answered. Patient expressed understanding and wished to proceed. Sterile technique was used throughout procedure. Epidural site was Dressed with sterile barrier dressing. No paresthesias, signs of intravascular injection Or signs of intrathecal spread were encountered.  Patient was more comfortable after the epidural was dosed. Please see RN's note for documentation of vital signs and FHR which are stable. Reason for block:procedure for pain

## 2024-11-01 ENCOUNTER — Encounter: Admitting: Family Medicine

## 2024-11-01 ENCOUNTER — Other Ambulatory Visit: Payer: Self-pay

## 2024-11-01 DIAGNOSIS — Z3490 Encounter for supervision of normal pregnancy, unspecified, unspecified trimester: Secondary | ICD-10-CM | POA: Diagnosis not present

## 2024-11-01 DIAGNOSIS — B20 Human immunodeficiency virus [HIV] disease: Secondary | ICD-10-CM

## 2024-11-01 LAB — COMPREHENSIVE METABOLIC PANEL WITH GFR
ALT: 17 U/L (ref 0–44)
AST: 41 U/L (ref 15–41)
Albumin: 3.3 g/dL — ABNORMAL LOW (ref 3.5–5.0)
Alkaline Phosphatase: 196 U/L — ABNORMAL HIGH (ref 38–126)
Anion gap: 13 (ref 5–15)
BUN: <5 mg/dL — ABNORMAL LOW (ref 6–20)
CO2: 22 mmol/L (ref 22–32)
Calcium: 7.9 mg/dL — ABNORMAL LOW (ref 8.9–10.3)
Chloride: 94 mmol/L — ABNORMAL LOW (ref 98–111)
Creatinine, Ser: 0.78 mg/dL (ref 0.44–1.00)
GFR, Estimated: >60 mL/min
Glucose, Bld: 100 mg/dL — ABNORMAL HIGH (ref 70–99)
Potassium: 3.1 mmol/L — ABNORMAL LOW (ref 3.5–5.1)
Sodium: 129 mmol/L — ABNORMAL LOW (ref 135–145)
Total Bilirubin: 0.4 mg/dL (ref 0.0–1.2)
Total Protein: 7.2 g/dL (ref 6.5–8.1)

## 2024-11-01 LAB — CBC
HCT: 36.7 % (ref 36.0–46.0)
Hemoglobin: 12.6 g/dL (ref 12.0–15.0)
MCH: 27.2 pg (ref 26.0–34.0)
MCHC: 34.3 g/dL (ref 30.0–36.0)
MCV: 79.3 fL — ABNORMAL LOW (ref 80.0–100.0)
Platelets: 218 K/uL (ref 150–400)
RBC: 4.63 MIL/uL (ref 3.87–5.11)
RDW: 16 % — ABNORMAL HIGH (ref 11.5–15.5)
WBC: 7.6 K/uL (ref 4.0–10.5)
nRBC: 0 % (ref 0.0–0.2)

## 2024-11-01 LAB — MAGNESIUM: Magnesium: 7.7 mg/dL (ref 1.7–2.4)

## 2024-11-01 LAB — URINE CULTURE: Culture: NO GROWTH

## 2024-11-01 MED ORDER — BICTEGRAVIR-EMTRICITAB-TENOFOV 50-200-25 MG PO TABS
1.0000 | ORAL_TABLET | Freq: Every day | ORAL | Status: DC
  Administered 2024-11-01: 1 via ORAL
  Filled 2024-11-01 (×2): qty 1

## 2024-11-01 NOTE — Congregational Nurse Program (Signed)
" °  Dept: [phone]   Congregational Nurse Program Note  Date of Encounter: 11/01/2024  Past Medical History: Past Medical History:  Diagnosis Date   HIV (human immunodeficiency virus infection) (HCC)    HIV (human immunodeficiency virus infection) (HCC) 02/26/2022    Encounter Details:  Community Questionnaire - 11/01/24 1700       Questionnaire   Ask client: Do you give verbal consent for me to treat you today? Yes    Student Assistance N/A   Ezra Person, EMT   Location Patient Served  [facility]    [facility]    Population Status Migrant/Refugee    Insurance Medicaid    Insurance/Financial Assistance Referral N/A    Medication Have Medication Insecurities;Patient Medications Reviewed;Provided Medication Assistance    Medical Provider Yes    Screening Referrals Made N/A    Medical Referrals Made N/A    Medical Appointment Completed [facility] P[facility]inic;[facility] PCP/Clinic    CNP Interventions Advocate/Support;Navigate Healthcare System;Case Management;Counsel;Educate    Screenings CN Performed N/A    ED Visit Averted N/A    Life-Saving Intervention Made N/A        5 PM- Hospital visit done. Patient resting in bed without complaints. Car seat donation delivered to patient room for family member to take it home awaiting baby discharge from NICU. Patient appreciative. She is not having breast milk yet and is worried about it. Stated that she had a lot of breast  milk with her other babies.Emotional support offered. Encouraged to keep pumping as instructed by the lactation team.  Dr Gabriella at [facility] will be the pediatrician after discharge. Dr Gabriella has advised that I assist patient to schedule appointment after discharge. Patient is agreeable to this.  Naomie Doneta Bayman RN BSN PCCN  [facility] [phone]-cell [phone]-office     "

## 2024-11-01 NOTE — Progress Notes (Signed)
 Date and time results received: 11/01/2024 0744  Test: magnesium  Critical Value: 7.7  Name of Provider Notified: Dr. Alger  1st attempt to call at 0745, but MD was in another pt's room  2nd attempt: MD made aware in person on OBSC unit  Orders Received? Or Actions Taken?: Orders Received - See Orders for details d/c mag now

## 2024-11-01 NOTE — Progress Notes (Signed)
 Notified by Dr. Ozan of delivery for Caitlin Marks. We discussed follow up recommendations:  Plans to breastfeed. Recently VL undetectable < 50 copies. She is a good candidate for breastfeeding. She breastfed her previous son (31 yo) for 18 months without any trouble.   CDC and AFP both support shared decision making here with HIV+ moms maintaining viral suppression through ART during pregnancy, delivery, and after birth.  This decreases risk of transmission through breastfeeding to less than 1%, but not zero.  She has access to consistent biktarvy  and is working with congregational nursing team for community support.   Recommendations:  Please continue biktarvy  one tablet once daily as she was doing antepartum.  Please separate vitamins or iron supplements from biktarvy  > 4-6 hours apart to ensure all components are adequately absorbed in her ART.  I have coordinated follow up with a lab check in on 12/02/2023 @ 11:00 am.  She has in person follow up with me arranged 12/22/2023 1:30 pm [facility] team had requested Genosure Archive to help with infant prophylaxis - this was collected 12/11 and likely has an additional 2 weeks left before we will get results. She saw Delon Gorge Hudson [phone]. I will send a note to her today that she has delivered. Per their recommendations  Baby to take zidovudine  4mg /kg twice daily x 4 weeks; consider prolonged prophylaxis with daily nevirapine while breastfeeding in discussion with mom after that time  2 week Peds ID post partum visit was recommended     Caitlin Fireman, MSN, NP-C [facility] for Infectious Disease [facility]  [email or web address] Pager: [phone] Office: [phone] [facility] Main Line: [phone] *Secure Chat Communication Welcome  Total Encounter Time: 18 minutes

## 2024-11-01 NOTE — Anesthesia Postprocedure Evaluation (Signed)
"   Anesthesia Post Note  Patient: Caitlin Marks  Procedure(s) Performed: AN AD HOC LABOR EPIDURAL     Patient location during evaluation: Mother Baby Anesthesia Type: Epidural Level of consciousness: awake and alert Pain management: pain level controlled Vital Signs Assessment: post-procedure vital signs reviewed and stable Respiratory status: spontaneous breathing, nonlabored ventilation and respiratory function stable Cardiovascular status: stable Postop Assessment: no headache, no backache and epidural receding Anesthetic complications: no   No notable events documented.  Last Vitals:  Vitals:   11/01/24 0733 11/01/24 0756  BP: 116/79 112/77  Pulse: 98 93  Resp: 18 18  Temp: 36.4 C 36.4 C  SpO2: 97% 98%    Last Pain:  Vitals:   11/01/24 0756  TempSrc: Oral  PainSc:    Pain Goal:                   CHERILYN SLOUGH      "

## 2024-11-01 NOTE — Progress Notes (Signed)
 POSTPARTUM PROGRESS NOTE  PPD #1  Subjective:  Swahili interpreter used for today's visit  Caitlin Marks is a 33 y.o. [id number] s/p NSVD at [redacted]w[redacted]d. Today she notes she is doing well and reports no acute complaints.  She is anxious to see the baby. She denies any problems with ambulating, voiding or po intake. Denies nausea or vomiting. Pain is well controlled.  Lochia minimal Denies fever/chills/chest pain/SOB.  no HA, no blurry vision, no RUQ pain  Objective: Blood pressure 112/77, pulse 93, temperature 97.6 F (36.4 C), temperature source Oral, resp. rate 18, last menstrual period 02/12/2024, SpO2 98%, unknown if currently breastfeeding.  Physical Exam:  General: alert, cooperative and no distress Chest: no respiratory distress Heart: regular rate and rhythm Abdomen: soft, nontender Uterine Fundus: firm, appropriately tender DVT Evaluation: No calf swelling or tenderness Extremities: no edema Skin: warm, dry  Results for orders placed or performed during the hospital encounter of 10/31/24 (from the past 24 hours)  CBC with Differential/Platelet     Status: Abnormal   Collection Time: 10/31/24 10:31 AM  Result Value Ref Range   WBC 8.7 4.0 - 10.5 K/uL   RBC 4.36 3.87 - 5.11 MIL/uL   Hemoglobin 12.1 12.0 - 15.0 g/dL   HCT 65.4 (L) 63.9 - 53.9 %   MCV 79.1 (L) 80.0 - 100.0 fL   MCH 27.8 26.0 - 34.0 pg   MCHC 35.1 30.0 - 36.0 g/dL   RDW 84.1 (H) 88.4 - 84.4 %   Platelets 181 150 - 400 K/uL   nRBC 0.0 0.0 - 0.2 %   Neutrophils Relative % 86 %   Neutro Abs 7.5 1.7 - 7.7 K/uL   Lymphocytes Relative 8 %   Lymphs Abs 0.7 0.7 - 4.0 K/uL   Monocytes Relative 5 %   Monocytes Absolute 0.4 0.1 - 1.0 K/uL   Eosinophils Relative 0 %   Eosinophils Absolute 0.0 0.0 - 0.5 K/uL   Basophils Relative 0 %   Basophils Absolute 0.0 0.0 - 0.1 K/uL   Immature Granulocytes 1 %   Abs Immature Granulocytes 0.04 0.00 - 0.07 K/uL  CBC     Status: Abnormal   Collection Time: 11/01/24  5:55 AM   Result Value Ref Range   WBC 7.6 4.0 - 10.5 K/uL   RBC 4.63 3.87 - 5.11 MIL/uL   Hemoglobin 12.6 12.0 - 15.0 g/dL   HCT 63.2 63.9 - 53.9 %   MCV 79.3 (L) 80.0 - 100.0 fL   MCH 27.2 26.0 - 34.0 pg   MCHC 34.3 30.0 - 36.0 g/dL   RDW 83.9 (H) 88.4 - 84.4 %   Platelets 218 150 - 400 K/uL   nRBC 0.0 0.0 - 0.2 %  Comprehensive metabolic panel     Status: Abnormal   Collection Time: 11/01/24  5:55 AM  Result Value Ref Range   Sodium 129 (L) 135 - 145 mmol/L   Potassium 3.1 (L) 3.5 - 5.1 mmol/L   Chloride 94 (L) 98 - 111 mmol/L   CO2 22 22 - 32 mmol/L   Glucose, Bld 100 (H) 70 - 99 mg/dL   BUN <5 (L) 6 - 20 mg/dL   Creatinine, Ser 9.21 0.44 - 1.00 mg/dL   Calcium 7.9 (L) 8.9 - 10.3 mg/dL   Total Protein 7.2 6.5 - 8.1 g/dL   Albumin 3.3 (L) 3.5 - 5.0 g/dL   AST 41 15 - 41 U/L   ALT 17 0 - 44 U/L   Alkaline  Phosphatase 196 (H) 38 - 126 U/L   Total Bilirubin 0.4 0.0 - 1.2 mg/dL   GFR, Estimated >39 >39 mL/min   Anion gap 13 5 - 15  Magnesium      Status: Abnormal   Collection Time: 11/01/24  5:55 AM  Result Value Ref Range   Magnesium  7.7 (HH) 1.7 - 2.4 mg/dL    Assessment/Plan: Caitlin Marks is a 33 y.o. [phone] s/p NSVD at [redacted]w[redacted]d PPD#1 complicated by: 1) preeclampsia with severe features based on BPs -S/p magnesium  - BP stable with Procardia  30 mg twice daily -currently asymptomatic -continue Lasix /K x 5 days  2) Postpartum care -meeting milestones appropriately  Contraception: [place] Feeding: desires to breast-feed, baby currently in NICU  Dispo: Routine postpartum care as outlined above, will continue to closely monitor BPs   LOS: 1 day   Carmencita Cusic, DO Faculty Attending, [facility] 11/01/2024, 10:12 AM

## 2024-11-01 NOTE — Lactation Note (Signed)
 This note was copied from a baby's chart. Lactation Consultation Note  Patient Name: Caitlin Marks Unijb'd Date: 11/01/2024 Age:31 hours   OBSC RN Princess let this LC note that [employer] has delivered the [facility] pump to patient's room. She got the Medela Pump & Style Pro.   Azriella Mattia S Frazier Balfour 11/01/2024, 3:48 PM

## 2024-11-01 NOTE — Lactation Note (Signed)
 This note was copied from a baby's chart.  NICU Lactation Consultation Note  Patient Name: Caitlin Marks Date: 11/01/2024 Age:31 hours  Reason for consult: Follow-up assessment; Late-preterm 34-36.6wks; Infant < 5lbs; Other (Comment) (LPNC, maternal HIV (+), cHTN, SGA, FRG)  SUBJECTIVE Used [employer] interpreter services for Swahili interpreter # [phone] Caitlin Marks with family of 91 104/27 weeks old AGA NICU female [place]; Caitlin Marks is a P4 and reported she has not pumped today but she did it yesterday, praised her for her efforts. She might be getting discharged tomorrow. Reviewed discharge education, pump settings and the importance of consistent pumping for the onset of secretory activation and the prevention of engorgement. She was surprised that she won't be able to take baby with her when she goes home, provided empathy and let her know that is challenge for all our NICU families with premature and small babies.   Asked OBSC RN Princess to provide Caitlin Marks upon patient's arrival (this consult took place in the NICU) and instructed Caitlin Marks to discard her milk if she notices any traces of blood (pink milk, bleeding nipples) due to higher risk of HIV transmission. Her plan is to just breastfeed baby after her discharge from [facility]. Provided extensive education about how pumping is paramount to protect her supply with a NICU admission; even if baby goes to breast.  OBJECTIVE Infant data: Mother's Current Feeding Choice: Breast Milk and Formula  O2 Device: Room Air  Infant feeding assessment IDFS - Readiness: 3   Maternal data: [id number] Vaginal, Spontaneous Has patient been taught Hand Expression?: Yes Hand Expression Comments: no colostrum presently Current breast feeding challenges:: NICU admission Pumping frequency: 3 times/24 hours; did not pumped today Pumped volume: 0 mL Flange Size: 21 Risk factor for low/delayed milk supply:: prematurity, SGA, FGR, < 5 lbs  WIC  Program: Yes WIC Referral Sent?: Yes What county?: [place] Pump: Referral sent for [employer] Pump  ASSESSMENT Infant: Feeding Status: Scheduled 8-11-2-5 Feeding method: Tube/Gavage (Bolus)  Maternal: Milk volume: Normal  INTERVENTIONS/PLAN Interventions: Interventions: Breast feeding basics reviewed; Caitlin Marks; DEBP; Education Discharge Education: Engorgement and breast care Tools: Caitlin Marks Pump Education: Setup, frequency, and cleaning; Milk Storage  Plan: Breast massage, hand expression and Caitlin Marks prior to pumping Pump both breast on initiate mode every 3 hours for 15 minutes, ideally 8 pumping sessions/24 hours Switch to maintain mode once expressing + 20 ml of EBM combined Bring all pump pieces to baby's room after her discharge  No other support person at this time. All questions and concerns answered, family to contact [facility] services PRN.  Consult Status: NICU follow-up NICU Follow-up type: Maternal D/C visit; Verify onset of copious milk; Verify absence of engorgement   Caitlin Marks 11/01/2024, 12:16 PM

## 2024-11-02 ENCOUNTER — Other Ambulatory Visit (HOSPITAL_COMMUNITY): Payer: Self-pay

## 2024-11-02 LAB — SURGICAL PATHOLOGY

## 2024-11-02 MED ORDER — ACETAMINOPHEN 325 MG PO TABS: 650.0000 mg | ORAL_TABLET | ORAL | Status: AC | PRN

## 2024-11-02 MED ORDER — POTASSIUM CHLORIDE CRYS ER 20 MEQ PO TBCR
20.0000 meq | EXTENDED_RELEASE_TABLET | Freq: Every day | ORAL | 0 refills | Status: DC
  Filled 2024-11-02: qty 3, 3d supply, fill #0

## 2024-11-02 MED ORDER — FUROSEMIDE 20 MG PO TABS
20.0000 mg | ORAL_TABLET | Freq: Every day | ORAL | 0 refills | Status: DC
  Filled 2024-11-02: qty 3, 3d supply, fill #0

## 2024-11-02 MED ORDER — IBUPROFEN 600 MG PO TABS
600.0000 mg | ORAL_TABLET | Freq: Four times a day (QID) | ORAL | 0 refills | Status: AC
  Filled 2024-11-02: qty 30, 8d supply, fill #0

## 2024-11-02 MED ORDER — NIFEDIPINE ER 30 MG PO TB24
30.0000 mg | ORAL_TABLET | Freq: Every day | ORAL | 0 refills | Status: DC
  Filled 2024-11-02: qty 30, 30d supply, fill #0

## 2024-11-02 MED ORDER — NIFEDIPINE ER OSMOTIC RELEASE 30 MG PO TB24
30.0000 mg | ORAL_TABLET | Freq: Every day | ORAL | Status: DC
  Administered 2024-11-02: 30 mg via ORAL
  Filled 2024-11-02: qty 1

## 2024-11-02 NOTE — Plan of Care (Signed)
 Patient will be discharged home with printed instructions. VEVA LITTIE POD, RN

## 2024-11-02 NOTE — Lactation Note (Signed)
 This note was copied from a baby's chart. Lactation Consultation Note  Patient Name: Caitlin Marks Unijb'd Date: 11/02/2024 Age:31 hours  Reason for consult: Follow-up assessment;Other (Comment);NICU baby;Late-preterm 34-36.6wks (mother -HIV)  Remote Video Swahili Interpreter: Alm [phone]  [facility] visit for maternal discharge to home. Mother requested an insulated bag for transporting her milk to the hospital. Advised mother to keep refrigerated milk cold during transport by placing bag with ice around her expressed milk while in the insulated bag. Mother verbalized understanding.  Mother reports she has not pumped because she is not making milk yet. Encouraged to pump 8 times/ 24 hrs when away from her baby to stimulate and maintain hr milk supply.   Lactation will follow mother while baby is in the NICU. Encouraged mother to reach out/ request LC with questions or concerns regarding pumping or breastfeeding.    Feeding Mother's Current Feeding Choice: Breast Milk and Formula    Lactation Tools Discussed/Used Pumping frequency: has not pumped /24 hrs  Interventions Interventions: Breast feeding basics reviewed;Education  Discharge Discharge Education: Engorgement and breast care Pump: Received Stork Pump  Consult Status Consult Status: NICU follow-up    Joshua Rojelio HERO 11/02/2024, 10:18 AM

## 2024-11-04 ENCOUNTER — Other Ambulatory Visit

## 2024-11-04 ENCOUNTER — Other Ambulatory Visit (HOSPITAL_COMMUNITY): Admit: 2024-11-04 | Payer: Self-pay

## 2024-11-04 ENCOUNTER — Other Ambulatory Visit (HOSPITAL_COMMUNITY)
Admission: AD | Admit: 2024-11-04 | Discharge: 2024-11-04 | Disposition: A | Discharge status: Home / Self Care | Source: Ambulatory Visit | Attending: Obstetrics and Gynecology | Admitting: Obstetrics and Gynecology

## 2024-11-04 ENCOUNTER — Other Ambulatory Visit: Payer: Self-pay | Admitting: Obstetrics and Gynecology

## 2024-11-04 ENCOUNTER — Ambulatory Visit

## 2024-11-04 DIAGNOSIS — Z21 Asymptomatic human immunodeficiency virus [HIV] infection status: Secondary | ICD-10-CM | POA: Insufficient documentation

## 2024-11-05 LAB — HIV-1 RNA QUANT-NO REFLEX-BLD
HIV 1 RNA Quant: 50 {copies}/mL
LOG10 HIV-1 RNA: 1.699 {Log_copies}/mL

## 2024-11-07 ENCOUNTER — Ambulatory Visit

## 2024-11-07 ENCOUNTER — Telehealth: Payer: Self-pay

## 2024-11-07 NOTE — Telephone Encounter (Signed)
 Patient is requesting transportation assistance for tomorrows appointment. Ride Scheduled.  Naomie Latham Kinzler RN BSN PCCN  [facility] [phone]-cell [phone]-office

## 2024-11-08 ENCOUNTER — Other Ambulatory Visit: Payer: Self-pay

## 2024-11-08 ENCOUNTER — Ambulatory Visit (INDEPENDENT_AMBULATORY_CARE_PROVIDER_SITE_OTHER): Payer: Self-pay

## 2024-11-08 VITALS — BP 139/104 | HR 97 | Wt 158.4 lb

## 2024-11-08 DIAGNOSIS — Z0130 Encounter for examination of blood pressure without abnormal findings: Secondary | ICD-10-CM

## 2024-11-08 LAB — HIV-1/2 AB - DIFFERENTIATION
HIV 1 Ab: REACTIVE — AB
HIV 2 Ab: NONREACTIVE

## 2024-11-08 NOTE — Progress Notes (Signed)
 Blood Pressure Check Visit  Caitlin Marks is here for blood pressure check following spontaneous vaginal birth on 11/01/24 with diagnosis of chronic hypertension. Completed Lasix  20 mg daily x 3 days with potassium replacement. Taking nifedipine  30 mg nightly; last taken yesterday around 8 PM. BP today is 139/102; HR 97. Recheck BP is 139/104. No headache, dizziness, vision changes, or RUQ pain. Reviewed with Dr. Jomarie who recommends increasing nifedipine  30 mg to BID. Patient to return Friday, 11/12/23 for BP recheck.  Encounter completed with [employer] Swahili interpreter [place] [phone].  Vernell FORBES Ruddle, RN 11/08/2024  9:44 AM

## 2024-11-11 ENCOUNTER — Other Ambulatory Visit: Payer: Self-pay | Admitting: Pharmacy Technician

## 2024-11-11 ENCOUNTER — Ambulatory Visit: Payer: Self-pay

## 2024-11-11 ENCOUNTER — Other Ambulatory Visit: Payer: Self-pay

## 2024-11-11 NOTE — Progress Notes (Signed)
 Specialty Pharmacy Refill Coordination Note  Caitlin Marks is a 34 y.o. female contacted today regarding refills of specialty medication(s) Bictegravir-Emtricitab-Tenofov (Biktarvy )   Patient requested No data recorded  Delivery date: 11/17/24   Verified address: [address]  [place]   Medication will be filled on: 11/16/24

## 2024-11-14 ENCOUNTER — Ambulatory Visit

## 2024-11-14 ENCOUNTER — Other Ambulatory Visit

## 2024-11-14 NOTE — Lactation Note (Addendum)
 This note was copied from a baby's chart.  NICU Lactation Consultation Note  Patient Name: Caitlin Marks Date: 11/14/2024 Age:32 wk.o.  Reason for consult: Weekly NICU follow-up; NICU baby; Early term 37-38.6wks; Other (Comment) (LPNC, maternal HIV (+), cHTN, SGA, FRG)  SUBJECTIVE Used in-house interpreter for Swahili Joceline for this encounter.  Visited with family of 97 104/58 weeks old AGA NICU female Caitlin Marks; Ms. Kreeger reported she's no longer pumping; she voiced her milk never came in (see maternal assessment); baby has had Similac 24 calorie formula only since birth. All questions and concerns answered, lactation services are completed at this time.  OBJECTIVE Infant data: Mother's Current Feeding Choice: Formula  O2 Device: Room Air  Infant feeding assessment IDFS - Readiness: 2 IDFS - Quality: 2   Maternal data: [id number] Vaginal, Spontaneous No data recorded WIC Program: Yes WIC Referral Sent?: Yes What county?: [place] Pump: Received [employer] Pump (Medela Pump & Style Pro)  ASSESSMENT Infant: Feeding Status: Scheduled 8-11-2-5 Feeding method: Bottle; Tube/Gavage (Bolus) Nipple Type: Dr. Jonna Fling Preemie  Maternal: No S/S of engorgement at this time  INTERVENTIONS/PLAN Interventions: Interventions: Education Discharge Education: Engorgement and breast care  Plan: Consult Status: Complete   Khi Mcmillen S Hearl Heikes 11/14/2024, 12:41 PM

## 2024-11-17 ENCOUNTER — Telehealth: Payer: Self-pay

## 2024-11-17 NOTE — Telephone Encounter (Signed)
 Patient called me and confirmed that specialty medication was delivered today. I will continue to assist as needed.  Naomie Anda Sobotta RN BSN PCCN  [facility] [phone]-cell [phone]-office

## 2024-11-23 ENCOUNTER — Other Ambulatory Visit: Payer: Self-pay

## 2024-11-23 ENCOUNTER — Telehealth: Payer: Self-pay

## 2024-11-23 DIAGNOSIS — Z21 Asymptomatic human immunodeficiency virus [HIV] infection status: Secondary | ICD-10-CM

## 2024-11-23 NOTE — Telephone Encounter (Signed)
 Appointment rescheduled with [facility] for infectious disease, due to conflict with patient schedule . Naomie Robert Sunga RN BSN PCCN  [facility] [phone]-cell [phone]-office

## 2024-11-29 NOTE — Congregational Nurse Program (Signed)
" °  Dept: [phone]   Congregational Nurse Program Note  Date of Encounter: 11/29/2024  Past Medical History: Past Medical History:  Diagnosis Date   HIV (human immunodeficiency virus infection) (HCC)    HIV (human immunodeficiency virus infection) (HCC) 02/26/2022    Encounter Details:  Community Questionnaire - 11/29/24 2115       Questionnaire   Ask client: Do you give verbal consent for me to treat you today? Yes    Student Assistance N/A   Ezra Person, EMT   Location Patient Served  [facility]    [facility]    Insurance [facility]    Insurance/Financial Assistance Referral N/A    Medication Have Medication Insecurities    Medical Provider Yes    Medical Appointment Completed [facility] P[facility]    Screenings CN Performed (remember to also record results) NA    CNP Interventions Advocate/Support;Case Management;Navigate Healthcare System    ED Visit Averted N/A         Patient requesting assistance with transportation for lab appointment tomorrow. Gisele ride scheduled.  Naomie Colby Reels RN BSN PCCN  [facility] [phone]-cell [phone]-office    "

## 2024-11-30 ENCOUNTER — Other Ambulatory Visit: Payer: Self-pay

## 2024-11-30 ENCOUNTER — Other Ambulatory Visit

## 2024-11-30 DIAGNOSIS — Z21 Asymptomatic human immunodeficiency virus [HIV] infection status: Secondary | ICD-10-CM

## 2024-12-01 ENCOUNTER — Other Ambulatory Visit: Payer: Self-pay

## 2024-12-02 ENCOUNTER — Other Ambulatory Visit (HOSPITAL_COMMUNITY): Payer: Self-pay

## 2024-12-02 ENCOUNTER — Other Ambulatory Visit: Payer: Self-pay

## 2024-12-02 LAB — HIV-1 RNA QUANT-NO REFLEX-BLD
HIV 1 RNA Quant: 26 {copies}/mL — ABNORMAL HIGH
HIV-1 RNA Quant, Log: 1.41 {Log_copies}/mL — ABNORMAL HIGH

## 2024-12-02 NOTE — Congregational Nurse Program (Signed)
" °  Dept: [phone]   Congregational Nurse Program Note  Date of Encounter: 12/02/2024  Past Medical History: Past Medical History:  Diagnosis Date   HIV (human immunodeficiency virus infection) (HCC)    HIV (human immunodeficiency virus infection) (HCC) 02/26/2022    Encounter Details:  Community Questionnaire - 12/02/24 0920       Questionnaire   Ask client: Do you give verbal consent for me to treat you today? Yes    Student Assistance N/A   Caitlin Marks, EMT   Location Patient Served  [facility]    Encounter Setting Phone/Text/Email    Population Status Migrant farmer/Refugee/Immigrant    Insurance Medicaid    Insurance/Financial Assistance Referral N/A    Medication Have Medication Insecurities;Patient Medications Reviewed    Medical Provider Yes    Medical Referrals Made NA    Medical Appointment Completed [facility] P[facility]Clinic    Screenings CN Performed (remember to also record results) NA    CNP Interventions Advocate/Support;Case Management;Navigate Healthcare System;Other Education    ED Visit Averted N/A        I have called to refill blood pressure meds per patient request. Medication will need provider authorization and pharmacy will reach out to provider. Medication will be delivered to patient residence.   Naomie Dewel Lotter RN BSN PCCN  [facility] [phone]-cell [phone]-office      "

## 2024-12-05 ENCOUNTER — Ambulatory Visit: Payer: Self-pay | Admitting: Infectious Diseases

## 2024-12-06 NOTE — Telephone Encounter (Signed)
 Called Caitlin Marks with [employer] [phone]), relayed that viral load continues to be undetectable. She reports that she is using formula exclusively now.   Jezebelle Ledwell, BSN, RN

## 2024-12-09 ENCOUNTER — Other Ambulatory Visit (HOSPITAL_COMMUNITY): Payer: Self-pay

## 2024-12-09 ENCOUNTER — Other Ambulatory Visit: Payer: Self-pay | Admitting: Pharmacy Technician

## 2024-12-09 ENCOUNTER — Telehealth: Payer: Self-pay

## 2024-12-09 ENCOUNTER — Other Ambulatory Visit: Payer: Self-pay

## 2024-12-09 NOTE — Progress Notes (Signed)
 Specialty Pharmacy Refill Coordination Note  Caitlin Marks is a 34 y.o. female contacted today regarding refills of specialty medication(s) Bictegravir-Emtricitab-Tenofov (Biktarvy )   Patient requested Delivery   Delivery date: 12/14/24   Verified address: [address]  [place]   Medication will be filled on: 12/12/24    12/09/2024 Spoke with nurse Naomie.

## 2024-12-09 NOTE — Telephone Encounter (Signed)
 I have contacted specialty pharmacy for medication refill and had patient Caitlin Marks on a 3 way call. Medication will be delivered to patient home next week.  Naomie Rickey Farrier RN BSN PCCN  [facility] [phone]-cell [phone]-office

## 2024-12-09 NOTE — Progress Notes (Signed)
 Specialty Pharmacy Ongoing Clinical Assessment Note  Was on 3-way call with patient and Dorothy with congregational nursing. Caitlin Marks is a 34 y.o. female who is being followed by the specialty pharmacy service for RxSp HIV   Patient's specialty medication(s) reviewed today: Bictegravir-Emtricitab-Tenofov (Biktarvy )   Missed doses in the last 4 weeks: 0   Patient/Caregiver did not have any additional questions or concerns.   Therapeutic benefit summary: Patient is achieving benefit   Adverse events/side effects summary: No adverse events/side effects   Patient's therapy is appropriate to: Continue    Goals Addressed             This Visit's Progress    Achieve Undetectable HIV Viral Load < 20   On track    Patient is on track. Patient will maintain adherence. Viral load remains undetectable.           Follow up: 12 months  [facility]

## 2024-12-12 ENCOUNTER — Other Ambulatory Visit: Payer: Self-pay | Admitting: Internal Medicine

## 2024-12-12 ENCOUNTER — Ambulatory Visit: Payer: Self-pay | Admitting: Obstetrics & Gynecology

## 2024-12-12 ENCOUNTER — Telehealth: Payer: Self-pay

## 2024-12-12 ENCOUNTER — Other Ambulatory Visit (HOSPITAL_COMMUNITY): Payer: Self-pay

## 2024-12-12 ENCOUNTER — Ambulatory Visit: Admitting: Internal Medicine

## 2024-12-12 ENCOUNTER — Telehealth: Payer: Self-pay | Admitting: Internal Medicine

## 2024-12-12 MED ORDER — NIFEDIPINE ER 30 MG PO TB24
30.0000 mg | ORAL_TABLET | Freq: Every day | ORAL | 0 refills | Status: AC
  Filled 2024-12-12: qty 30, 30d supply, fill #0

## 2024-12-12 NOTE — Congregational Nurse Program (Signed)
" °  Dept: [phone]   Congregational Nurse Program Note  Date of Encounter: 12/12/2024  Past Medical History: Past Medical History:  Diagnosis Date   HIV (human immunodeficiency virus infection) (HCC)    HIV (human immunodeficiency virus infection) (HCC) 02/26/2022    Encounter Details:   I have called pharmacy to refill blood pressure medications. Provider authorization is needed. Provided the name of the provider as Dr Vicci. Pharmacy had previously reached out to a different provider.I have reached out to [facility] and confirmed that medication refill was done by Dr Vicci. Appointment for follow up scheduled for March 9th 2026 @ 3 10pm  Naomie Aly RN BSN PCCN  [facility] [phone]-cell [phone]-office   "

## 2024-12-12 NOTE — Telephone Encounter (Signed)
 Contacted pt answered but didn't say anything tried to resch appt. Office closed due to weather (if the pt calls back please resch appt)

## 2024-12-12 NOTE — Telephone Encounter (Signed)
 Patient called and informed to expect medication delivery and that follow up appointment has been scheduled for March 9th 2026.  Naomie Shakeria Robinette RN BSN PCCN  [facility] [phone]-cell [phone]-office

## 2024-12-13 ENCOUNTER — Ambulatory Visit: Admitting: Obstetrics and Gynecology

## 2024-12-13 ENCOUNTER — Telehealth: Payer: Self-pay

## 2024-12-19 ENCOUNTER — Ambulatory Visit: Admitting: Certified Nurse Midwife

## 2024-12-21 ENCOUNTER — Ambulatory Visit: Payer: Self-pay | Admitting: Infectious Diseases

## 2025-01-16 ENCOUNTER — Ambulatory Visit: Payer: Self-pay | Admitting: Internal Medicine
